# Patient Record
Sex: Female | Born: 1985 | Race: Black or African American | Hispanic: No | Marital: Single | Smoking: Never smoker
Health system: Southern US, Community
[De-identification: ages and names within clinical notes are randomized; demographics above are authoritative.]

## PROBLEM LIST (undated history)

## (undated) DIAGNOSIS — E739 Lactose intolerance, unspecified: Secondary | ICD-10-CM

## (undated) DIAGNOSIS — M549 Dorsalgia, unspecified: Secondary | ICD-10-CM

## (undated) DIAGNOSIS — E7800 Pure hypercholesterolemia, unspecified: Secondary | ICD-10-CM

## (undated) DIAGNOSIS — R112 Nausea with vomiting, unspecified: Secondary | ICD-10-CM

## (undated) DIAGNOSIS — M419 Scoliosis, unspecified: Secondary | ICD-10-CM

## (undated) DIAGNOSIS — I10 Essential (primary) hypertension: Secondary | ICD-10-CM

## (undated) DIAGNOSIS — D649 Anemia, unspecified: Secondary | ICD-10-CM

## (undated) DIAGNOSIS — F419 Anxiety disorder, unspecified: Secondary | ICD-10-CM

## (undated) DIAGNOSIS — F32A Depression, unspecified: Secondary | ICD-10-CM

## (undated) DIAGNOSIS — E559 Vitamin D deficiency, unspecified: Secondary | ICD-10-CM

## (undated) DIAGNOSIS — K811 Chronic cholecystitis: Secondary | ICD-10-CM

## (undated) HISTORY — DX: Depression, unspecified: F32.A

## (undated) HISTORY — DX: Pure hypercholesterolemia, unspecified: E78.00

## (undated) HISTORY — DX: Essential (primary) hypertension: I10

## (undated) HISTORY — DX: Dorsalgia, unspecified: M54.9

## (undated) HISTORY — DX: Vitamin D deficiency, unspecified: E55.9

## (undated) HISTORY — DX: Anemia, unspecified: D64.9

## (undated) HISTORY — PX: FIXATION KYPHOPLASTY: SHX860

## (undated) HISTORY — DX: Lactose intolerance, unspecified: E73.9

---
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
## 2006-06-17 ENCOUNTER — Emergency Department (HOSPITAL_COMMUNITY): Admission: EM | Admit: 2006-06-17 | Discharge: 2006-06-17 | Payer: Self-pay | Admitting: Emergency Medicine

## 2008-08-19 ENCOUNTER — Emergency Department (HOSPITAL_COMMUNITY): Admission: EM | Admit: 2008-08-19 | Discharge: 2008-08-19 | Payer: Self-pay | Admitting: Family Medicine

## 2009-11-05 ENCOUNTER — Observation Stay (HOSPITAL_COMMUNITY): Admission: AD | Admit: 2009-11-05 | Discharge: 2009-11-06 | Payer: Self-pay | Admitting: Obstetrics and Gynecology

## 2010-01-25 ENCOUNTER — Inpatient Hospital Stay (HOSPITAL_COMMUNITY): Admission: RE | Admit: 2010-01-25 | Discharge: 2010-01-28 | Payer: Self-pay | Admitting: Obstetrics and Gynecology

## 2011-01-08 LAB — COMPREHENSIVE METABOLIC PANEL
BUN: 4 mg/dL — ABNORMAL LOW (ref 6–23)
Calcium: 9.2 mg/dL (ref 8.4–10.5)
Creatinine, Ser: 0.35 mg/dL — ABNORMAL LOW (ref 0.4–1.2)
GFR calc non Af Amer: >60 mL/min (ref >60)
Glucose, Bld: 86 mg/dL (ref 70–99)
Total Protein: 6.4 g/dL (ref 6.0–8.3)

## 2011-01-08 LAB — CBC
HCT: 34.5 % — ABNORMAL LOW (ref 36.0–46.0)
Hemoglobin: 11.6 g/dL — ABNORMAL LOW (ref 12.0–15.0)
MCHC: 33.6 g/dL (ref 30.0–36.0)
MCV: 86.2 fL (ref 78.0–100.0)
Platelets: 315 10*3/uL (ref 150–400)
RBC: 4 MIL/uL (ref 3.87–5.11)
RDW: 14.8 % (ref 11.5–15.5)
WBC: 8.7 10*3/uL (ref 4.0–10.5)

## 2011-01-08 LAB — URIC ACID: Uric Acid, Serum: 3.5 mg/dL (ref 2.4–7.0)

## 2011-01-11 LAB — CBC
HCT: 35 % — ABNORMAL LOW (ref 36.0–46.0)
Hemoglobin: 11.7 g/dL — ABNORMAL LOW (ref 12.0–15.0)
Hemoglobin: 8.5 g/dL — ABNORMAL LOW (ref 12.0–15.0)
MCHC: 33.8 g/dL (ref 30.0–36.0)
MCV: 86.5 fL (ref 78.0–100.0)
RBC: 2.92 MIL/uL — ABNORMAL LOW (ref 3.87–5.11)
WBC: 17.3 10*3/uL — ABNORMAL HIGH (ref 4.0–10.5)
WBC: 8.5 10*3/uL (ref 4.0–10.5)

## 2011-07-25 LAB — POCT INFECTIOUS MONO SCREEN: Mono Screen: NEGATIVE

## 2011-11-03 ENCOUNTER — Emergency Department (INDEPENDENT_AMBULATORY_CARE_PROVIDER_SITE_OTHER)
Admission: EM | Admit: 2011-11-03 | Discharge: 2011-11-03 | Disposition: A | Discharge status: Home / Self Care | Payer: 59 | Source: Home / Self Care | Attending: Family Medicine | Admitting: Family Medicine

## 2011-11-03 ENCOUNTER — Encounter (HOSPITAL_COMMUNITY): Payer: Self-pay

## 2011-11-03 DIAGNOSIS — H109 Unspecified conjunctivitis: Secondary | ICD-10-CM

## 2011-11-03 MED ORDER — CIPROFLOXACIN HCL 0.3 % OP SOLN: 1.0000 [drp] | OPHTHALMIC | Status: AC

## 2011-11-03 NOTE — ED Notes (Signed)
2 day duration of right eye reddness

## 2011-12-05 NOTE — ED Provider Notes (Signed)
History     CSN: [id number]  Arrival date & time 11/03/11  8295   First MD Initiated Contact with Patient 11/03/11 1926      Chief Complaint  Patient presents with  . Conjunctivitis    (Consider location/radiation/quality/duration/timing/severity/associated sxs/prior treatment) Patient is a 26 y.o. female presenting with conjunctivitis. The history is provided by the patient.  Conjunctivitis  The current episode started 2 days ago. The onset was sudden. The problem has been unchanged. The symptoms are aggravated by nothing. Associated symptoms include eye itching and eye redness. Pertinent negatives include no eye discharge.    History reviewed. No pertinent past medical history.  Past Surgical History  Procedure Date  . Cesarean section   . Fixation kyphoplasty     History reviewed. No pertinent family history.  History  Substance Use Topics  . Smoking status: Never Smoker   . Smokeless tobacco: Not on file  . Alcohol Use: Yes    OB History    Grav Para Term Preterm Abortions TAB SAB Ect Mult Living                  Review of Systems  Constitutional: Negative.   HENT: Negative.   Eyes: Positive for redness and itching. Negative for discharge and visual disturbance.  Respiratory: Negative.   Cardiovascular: Negative.   Gastrointestinal: Negative.   Genitourinary: Negative.   Musculoskeletal: Negative.   Skin: Negative.   Neurological: Negative.     Allergies  Review of patient's allergies indicates no known allergies.  Home Medications  No current outpatient prescriptions on file.  BP 133/78  Pulse 89  Temp(Src) 98.7 F (37.1 C) (Oral)  Resp 18  SpO2 100%  LMP 10/08/2011  Physical Exam  Nursing note and vitals reviewed. Constitutional: She is oriented to person, place, and time. She appears well-developed and well-nourished.  HENT:  Head: Normocephalic and atraumatic.  Eyes: EOM and lids are normal. Pupils are equal, round, and reactive to  light. Right conjunctiva is injected. Left conjunctiva is not injected.  Neck: Normal range of motion.  Pulmonary/Chest: Effort normal.  Musculoskeletal: Normal range of motion.  Neurological: She is alert and oriented to person, place, and time.  Skin: Skin is warm and dry.  Psychiatric: Her behavior is normal.    ED Course  Procedures (including critical care time)  Labs Reviewed - No data to display No results found.   1. Conjunctivitis       MDM  Likely viral but will cover with ciprofloxacin; follow up if no improvement        Richardo Priest, MD 12/05/11 1945

## 2014-02-24 ENCOUNTER — Emergency Department (INDEPENDENT_AMBULATORY_CARE_PROVIDER_SITE_OTHER)
Admission: EM | Admit: 2014-02-24 | Discharge: 2014-02-24 | Disposition: A | Discharge status: Home / Self Care | Payer: Medicaid Other | Source: Home / Self Care

## 2014-02-24 ENCOUNTER — Encounter (HOSPITAL_COMMUNITY): Payer: Self-pay | Admitting: Emergency Medicine

## 2014-02-24 ENCOUNTER — Other Ambulatory Visit (HOSPITAL_COMMUNITY)
Admission: RE | Admit: 2014-02-24 | Discharge: 2014-02-24 | Disposition: A | Discharge status: Home / Self Care | Payer: Medicaid Other | Source: Ambulatory Visit | Attending: Emergency Medicine | Admitting: Emergency Medicine

## 2014-02-24 DIAGNOSIS — A499 Bacterial infection, unspecified: Secondary | ICD-10-CM

## 2014-02-24 DIAGNOSIS — Z113 Encounter for screening for infections with a predominantly sexual mode of transmission: Secondary | ICD-10-CM | POA: Insufficient documentation

## 2014-02-24 DIAGNOSIS — N760 Acute vaginitis: Secondary | ICD-10-CM

## 2014-02-24 DIAGNOSIS — N72 Inflammatory disease of cervix uteri: Secondary | ICD-10-CM

## 2014-02-24 DIAGNOSIS — N898 Other specified noninflammatory disorders of vagina: Secondary | ICD-10-CM

## 2014-02-24 DIAGNOSIS — B9689 Other specified bacterial agents as the cause of diseases classified elsewhere: Secondary | ICD-10-CM

## 2014-02-24 DIAGNOSIS — R102 Pelvic and perineal pain: Secondary | ICD-10-CM

## 2014-02-24 DIAGNOSIS — N949 Unspecified condition associated with female genital organs and menstrual cycle: Secondary | ICD-10-CM

## 2014-02-24 LAB — POCT URINALYSIS DIP (DEVICE)
Bilirubin Urine: NEGATIVE
GLUCOSE, UA: NEGATIVE mg/dL
HGB URINE DIPSTICK: NEGATIVE
Ketones, ur: NEGATIVE mg/dL
NITRITE: NEGATIVE
Protein, ur: NEGATIVE mg/dL
Specific Gravity, Urine: 1.02 (ref 1.005–1.030)
UROBILINOGEN UA: 1 mg/dL (ref 0.0–1.0)
pH: 6.5 (ref 5.0–8.0)

## 2014-02-24 LAB — OCCULT BLOOD, POC DEVICE: Fecal Occult Bld: NEGATIVE

## 2014-02-24 LAB — POCT PREGNANCY, URINE: Preg Test, Ur: NEGATIVE

## 2014-02-24 MED ORDER — AZITHROMYCIN 250 MG PO TABS
1000.0000 mg | ORAL_TABLET | Freq: Every day | ORAL | Status: DC
  Administered 2014-02-24: 1000 mg via ORAL

## 2014-02-24 MED ORDER — LIDOCAINE HCL (PF) 1 % IJ SOLN
INTRAMUSCULAR | Status: AC
  Filled 2014-02-24: qty 5

## 2014-02-24 MED ORDER — CEFTRIAXONE SODIUM 1 G IJ SOLR
INTRAMUSCULAR | Status: AC
  Filled 2014-02-24: qty 10

## 2014-02-24 MED ORDER — CEFTRIAXONE SODIUM 250 MG IJ SOLR
500.0000 mg | Freq: Once | INTRAMUSCULAR | Status: AC
  Administered 2014-02-24: 500 mg via INTRAMUSCULAR

## 2014-02-24 MED ORDER — METRONIDAZOLE 500 MG PO TABS: 500.0000 mg | ORAL_TABLET | Freq: Two times a day (BID) | ORAL | Status: DC

## 2014-02-24 MED ORDER — AZITHROMYCIN 250 MG PO TABS
ORAL_TABLET | ORAL | Status: AC
  Filled 2014-02-24: qty 4

## 2014-02-24 NOTE — ED Provider Notes (Signed)
Medical screening examination/treatment/procedure(s) were performed by non-physician practitioner and as supervising physician I was immediately available for consultation/collaboration.  Leslee Homeavid Xyon Lukasik, M.D.  Reuben Likesavid C Crickett Abbett, MD 02/24/14 [phone]

## 2014-02-24 NOTE — Discharge Instructions (Signed)
Bacterial Vaginosis Bacterial vaginosis is a vaginal infection that occurs when the normal balance of bacteria in the vagina is disrupted. It results from an overgrowth of certain bacteria. This is the most common vaginal infection in women of childbearing age. Treatment is important to prevent complications, especially in pregnant women, as it can cause a premature delivery. CAUSES  Bacterial vaginosis is caused by an increase in harmful bacteria that are normally present in smaller amounts in the vagina. Several different kinds of bacteria can cause bacterial vaginosis. However, the reason that the condition develops is not fully understood. RISK FACTORS Certain activities or behaviors can put you at an increased risk of developing bacterial vaginosis, including:  Having a new sex partner or multiple sex partners.  Douching.  Using an intrauterine device (IUD) for contraception. Women do not get bacterial vaginosis from toilet seats, bedding, swimming pools, or contact with objects around them. SIGNS AND SYMPTOMS  Some women with bacterial vaginosis have no signs or symptoms. Common symptoms include:  Grey vaginal discharge.  A fishlike odor with discharge, especially after sexual intercourse.  Itching or burning of the vagina and vulva.  Burning or pain with urination. DIAGNOSIS  Your health care provider will take a medical history and examine the vagina for signs of bacterial vaginosis. A sample of vaginal fluid may be taken. Your health care provider will look at this sample under a microscope to check for bacteria and abnormal cells. A vaginal pH test may also be done.  TREATMENT  Bacterial vaginosis may be treated with antibiotic medicines. These may be given in the form of a pill or a vaginal cream. A second round of antibiotics may be prescribed if the condition comes back after treatment.  HOME CARE INSTRUCTIONS   Only take over-the-counter or prescription medicines as  directed by your health care provider.  If antibiotic medicine was prescribed, take it as directed. Make sure you finish it even if you start to feel better.  Do not have sex until treatment is completed.  Tell all sexual partners that you have a vaginal infection. They should see their health care provider and be treated if they have problems, such as a mild rash or itching.  Practice safe sex by using condoms and only having one sex partner. SEEK MEDICAL CARE IF:   Your symptoms are not improving after 3 days of treatment.  You have increased discharge or pain.  You have a fever. MAKE SURE YOU:   Understand these instructions.  Will watch your condition.  Will get help right away if you are not doing well or get worse. FOR MORE INFORMATION  Centers for Disease Control and Prevention, Division of STD Prevention: SolutionApps.co.zawww.cdc.gov/std American Sexual Health Association (ASHA): www.ashastd.org  Document Released: 10/09/2005 Document Revised: 07/30/2013 Document Reviewed: 05/21/2013 [facility] Patient Information 2014 [place], [place].  Cervicitis Cervicitis is a soreness and swelling (inflammation) of the cervix. Your cervix is located at the bottom of your uterus. It opens up to the vagina. CAUSES   Sexually transmitted infections (STIs).   Allergic reaction.   Medicines or birth control devices that are put in the vagina.   Injury to the cervix.   Bacterial infections.  RISK FACTORS You are at greater risk if you:  Have unprotected sexual intercourse.  Have sexual intercourse with many partners.  Began sexual intercourse at an early age.  Have a history of STIs. SYMPTOMS  There may be no symptoms. If symptoms occur, they may include:   Juanita CraverGrey,  white, yellow, or bad-smelling vaginal discharge.   Pain or itching of the area outside the vagina.   Painful sexual intercourse.   Lower abdominal or lower back pain, especially during intercourse.   Frequent  urination.   Abnormal vaginal bleeding between periods, after sexual intercourse, or after menopause.   Pressure or a heavy feeling in the pelvis.  DIAGNOSIS  Diagnosis is made after a pelvic exam. Other tests may include:   Examination of any discharge under a microscope (wet prep).   A Pap test.  TREATMENT  Treatment will depend on the cause of cervicitis. If it is caused by an STI, both you and your partner will need to be treated. Antibiotic medicines will be given.  HOME CARE INSTRUCTIONS   Do not have sexual intercourse until your health care provider says it is okay.   Do not have sexual intercourse until your partner has been treated, if your cervicitis is caused by an STI.   Take your antibiotics as directed. Finish them even if you start to feel better.  SEEK MEDICAL CARE IF:  Your symptoms come back.   You have a fever.  MAKE SURE YOU:   Understand these instructions.  Will watch your condition.  Will get help right away if you are not doing well or get worse. Document Released: 10/09/2005 Document Revised: 06/11/2013 Document Reviewed: 04/02/2013 [facility] Patient Information 2014 [place], [place].  Pelvic Inflammatory Disease Pelvic inflammatory disease (PID) refers to an infection in some or all of the female organs. The infection can be in the uterus, ovaries, fallopian tubes, or the surrounding tissues in the pelvis. PID can cause abdominal or pelvic pain that comes on suddenly (acute pelvic pain). PID is a serious infection because it can lead to lasting (chronic) pelvic pain or the inability to have children (infertile).  CAUSES  The infection is often caused by the normal bacteria found in the vaginal tissues. PID may also be caused by an infection that is spread during sexual contact. PID can also occur following:   The birth of a baby.   A miscarriage.   An abortion.   Major pelvic surgery.   The use of an intrauterine device (IUD).    A sexual assault.  RISK FACTORS Certain factors can put a person at higher risk for PID, such as:  Being younger than 25 years.  Being sexually active at [place] age.  Usingnonbarrier contraception.  Havingmultiple sexual partners.  Having sex with someone who has symptoms of a genital infection.  Using oral contraception. Other times, certain behaviors can increase the possibility of getting PID, such as:  Having sex during your period.  Using a vaginal douche.  Having an intrauterine device (IUD) in place. SYMPTOMS   Abdominal or pelvic pain.   Fever.   Chills.   Abnormal vaginal discharge.  Abnormal uterine bleeding.   Unusual pain shortly after finishing your period. DIAGNOSIS  Your caregiver will choose some of the following methods to make a diagnosis, such as:   Performinga physical exam and history. A pelvic exam typically reveals a very tender uterus and surrounding pelvis.   Ordering laboratory tests including a pregnancy test, blood tests, and urine test.  Orderingcultures of the vagina and cervix to check for a sexually transmitted infection (STI).  Performing an ultrasound.   Performing a laparoscopic procedure to look inside the pelvis.  TREATMENT   Antibiotic medicines may be prescribed and taken by mouth.   Sexual partners may be treated when  the infection is caused by a sexually transmitted disease (STD).   Hospitalization may be needed to give antibiotics intravenously.  Surgery may be needed, but this is rare. It may take weeks until you are completely well. If you are diagnosed with PID, you should also be checked for human immunodeficiency virus (HIV). HOME CARE INSTRUCTIONS   If given, take your antibiotics as directed. Finish the medicine even if you start to feel better.   Only take over-the-counter or prescription medicines for pain, discomfort, or fever as directed by your caregiver.   Do not have sexual  intercourse until treatment is completed or as directed by your caregiver. If PID is confirmed, your recent sexual partner(s) will need treatment.   Keep your follow-up appointments. SEEK MEDICAL CARE IF:   You have increased or abnormal vaginal discharge.   You need prescription medicine for your pain.   You vomit.   You cannot take your medicines.   Your partner has an STD.  SEEK IMMEDIATE MEDICAL CARE IF:   You have a fever.   You have increased abdominal or pelvic pain.   You have chills.   You have pain when you urinate.   You are not better after 72 hours following treatment.  MAKE SURE YOU:   Understand these instructions.  Will watch your condition.  Will get help right away if you are not doing well or get worse. Document Released: 10/09/2005 Document Revised: 02/03/2013 Document Reviewed: 10/05/2011 [facility] Patient Information 2014 [place], [place].  Sexually Transmitted Disease A sexually transmitted disease (STD) is a disease or infection. It may be passed from person to person. It usually is passed during sex. STDs can be spread by different types of germs. These germs are bacteria, viruses, and parasites. An STD can be passed through:  Spit (saliva).  Semen.  Blood.  Mucus from the vagina.  Pee (urine). HOW CAN I LESSEN MY CHANCES OF GETTING AN STD?  Only use condoms labeled "latex," dental dams, and lubricants that wash away with water (water soluble). Do not use petroleum jelly or oils.  Get shots (vaccines) for HPV and hepatitis.  Avoid risky sex behavior that can break the skin. WHAT SHOULD I DO IF I THINK I HAVE AN STD?  See your doctor.  Tell your sex partner(s) that you have an STD. They should be tested and treated.  Do not have sex until your doctor says it is OK. WHEN SHOULD I GET HELP? Get help if:  You have bad belly (abdominal) pain.  You are a man and have puffiness (swelling) or pain in your testicles.  You  are a woman and have puffiness in your vagina. MAKE SURE YOU:  Understand these instructions. Document Released: 11/16/2004 Document Revised: 07/30/2013 Document Reviewed: 04/04/2013 [facility] Patient Information 2014 [place], [place].

## 2014-02-24 NOTE — ED Notes (Signed)
Patient c/o stomach and rectal pain onset this morning. Patient denies urinary problems or changes in bowel movements. Pt is alert and oriented and in no acute distress.

## 2014-02-24 NOTE — ED Provider Notes (Signed)
CSN: [id number]     Arrival date & time 02/24/14  1501 History   First MD Initiated Contact with Patient 02/24/14 1640     Chief Complaint  Patient presents with  . Abdominal Pain  . Rectal Pain   (Consider location/radiation/quality/duration/timing/severity/associated sxs/prior Treatment) HPI Comments: 28 year old female with severe obesity presents with pain in the lower mid pelvis. Occasionally she will have pain in the rectum. It started this morning around 6:45 AM. It is becoming worse. She denies vaginal symptoms, urinary symptoms or GI symptoms. She has an IUD in place.   History reviewed. No pertinent past medical history. Past Surgical History  Procedure Laterality Date  . Cesarean section    . Fixation kyphoplasty     No family history on file. History  Substance Use Topics  . Smoking status: Never Smoker   . Smokeless tobacco: Not on file  . Alcohol Use: Yes   OB History   Grav Para Term Preterm Abortions TAB SAB Ect Mult Living                 Review of Systems  Constitutional: Negative.   Respiratory: Negative.   Cardiovascular: Negative for chest pain.  Gastrointestinal: Positive for rectal pain.  Genitourinary: Positive for pelvic pain. Negative for dysuria, frequency, flank pain, vaginal bleeding, vaginal discharge, difficulty urinating, vaginal pain and menstrual problem.  Musculoskeletal: Negative.   Skin: Negative.     Allergies  Review of patient's allergies indicates no known allergies.  Home Medications   Prior to Admission medications   Not on File   BP 156/98  Pulse 96  Temp(Src) 98.8 F (37.1 C) (Oral)  Resp 18  SpO2 100%  LMP 02/09/2014 Physical Exam  Nursing note and vitals reviewed. Constitutional: She is oriented to person, place, and time. She appears well-developed. No distress.  Severe obesity  Eyes: Conjunctivae and EOM are normal.  Neck: Normal range of motion. Neck supple.  Cardiovascular: Normal rate and regular rhythm.    Pulmonary/Chest: Effort normal. No respiratory distress.  Abdominal: Soft. There is no tenderness. There is no rebound and no guarding.  Obese  Genitourinary: Guaiac negative stool.  Anterior pelvic exam reveals tenderness across the mid pelvis. Normal external female genitalia Some difficulty inserting speculum into vagina due to large body habitus. Portion of cx visible along with os . There is a PID string. Copious amount of green to white vag D/C. Cx tender. Speculum snaped "broke under the pressure" during the exam but able to obtain cytology specimens. Rectal exam limited due to body habitus. Nl sphincter. Able to DRE approx 2 cm only. Brown stool. No tenderness, masses.   Neurological: She is alert and oriented to person, place, and time. She exhibits normal muscle tone.  Skin: Skin is warm and dry.    ED Course  Procedures (including critical care time) Labs Review Labs Reviewed  POCT URINALYSIS DIP (DEVICE) - Abnormal; Notable for the following:    Leukocytes, UA TRACE (*)    All other components within normal limits  POCT PREGNANCY, URINE  OCCULT BLOOD, POC DEVICE  CERVICOVAGINAL ANCILLARY ONLY    Imaging Review No results found.   MDM   1. Cervicitis   2. BV (bacterial vaginosis)   3. Vaginal discharge   4. Pelvic pain     Cytology pending Rocephin 500 mg IM azithro 1 gm po Flagyl 500 bid Rx     Hayden Rasmussenavid Loriann Bosserman, NP 02/24/14 1728  Hayden Rasmussenavid Kailee Essman, NP 02/24/14 1730

## 2014-02-25 LAB — CERVICOVAGINAL ANCILLARY ONLY
Chlamydia: NEGATIVE
Neisseria Gonorrhea: NEGATIVE
Wet Prep (BD Affirm): NEGATIVE
Wet Prep (BD Affirm): NEGATIVE
Wet Prep (BD Affirm): NEGATIVE

## 2014-03-13 ENCOUNTER — Emergency Department (HOSPITAL_COMMUNITY): Payer: Medicaid Other

## 2014-03-13 ENCOUNTER — Encounter (HOSPITAL_COMMUNITY): Payer: Self-pay | Admitting: Emergency Medicine

## 2014-03-13 ENCOUNTER — Emergency Department (HOSPITAL_COMMUNITY)
Admission: EM | Admit: 2014-03-13 | Discharge: 2014-03-13 | Disposition: A | Discharge status: Home / Self Care | Payer: Medicaid Other | Attending: Emergency Medicine | Admitting: Emergency Medicine

## 2014-03-13 DIAGNOSIS — M546 Pain in thoracic spine: Secondary | ICD-10-CM | POA: Insufficient documentation

## 2014-03-13 DIAGNOSIS — Z9889 Other specified postprocedural states: Secondary | ICD-10-CM | POA: Insufficient documentation

## 2014-03-13 DIAGNOSIS — Z792 Long term (current) use of antibiotics: Secondary | ICD-10-CM | POA: Insufficient documentation

## 2014-03-13 MED ORDER — METHOCARBAMOL 500 MG PO TABS: 500.0000 mg | ORAL_TABLET | Freq: Two times a day (BID) | ORAL | Status: DC

## 2014-03-13 MED ORDER — IBUPROFEN 800 MG PO TABS: 800.0000 mg | ORAL_TABLET | Freq: Three times a day (TID) | ORAL | Status: DC

## 2014-03-13 NOTE — ED Notes (Signed)
Per pt sts worsening upper back pain over the past few days. sts that she has had back surgery in the past. Denies injury.

## 2014-03-13 NOTE — ED Provider Notes (Signed)
CSN: [id number]     Arrival date & time 03/13/14  1407 History  This chart was scribed for non-physician practitioner, Fayrene HelperBowie Cing [place], PA-C working with Gwyneth SproutWhitney Plunkett, MD by Greggory StallionKayla Andersen, ED scribe. This patient was seen in room TR07C/TR07C and the patient's care was started at 2:35 PM.   Chief Complaint  Patient presents with  . Back Pain   The history is provided by the patient. No language interpreter was used.   HPI Comments: Cathy Yang is a 28 y.o. female with history of scoliosis who presents to the Emergency Department complaining of worsening upper back pain that started 3 days ago. Pain does not radiate. Bending down and moving side to side worsens the pain. She has taken ibuprofen with no relief. Pt has had back surgery for her scoliosis when she was 14 years ago and has had intermittent back pain since then. States this is worse than her normal pain. Denies fever, hemoptysis, trouble breathing, numbness.   History reviewed. No pertinent past medical history. Past Surgical History  Procedure Laterality Date  . Cesarean section    . Fixation kyphoplasty     History reviewed. No pertinent family history. History  Substance Use Topics  . Smoking status: Never Smoker   . Smokeless tobacco: Not on file  . Alcohol Use: Yes   OB History   Grav Para Term Preterm Abortions TAB SAB Ect Mult Living                 Review of Systems  Constitutional: Negative for fever.  Respiratory: Negative for cough.   Musculoskeletal: Positive for back pain.  Neurological: Negative for numbness.  All other systems reviewed and are negative.  Allergies  Review of patient's allergies indicates no known allergies.  Home Medications   Prior to Admission medications   Medication Sig Start Date End Date Taking? Authorizing Provider  metroNIDAZOLE (FLAGYL) 500 MG tablet Take 1 tablet (500 mg total) by mouth 2 (two) times daily. X 7 days 02/24/14   Hayden Rasmussenavid Mabe, NP   BP 146/95  Pulse 111   Temp(Src) 98 F (36.7 C) (Oral)  Resp 16  Ht 5\' 5"  (1.651 m)  Wt 344 lb (156.037 kg)  BMI 57.24 kg/m2  SpO2 98%  LMP 03/08/2014  Physical Exam  Nursing note and vitals reviewed. Constitutional: She is oriented to person, place, and time. She appears well-developed and well-nourished. No distress.  HENT:  Head: Normocephalic and atraumatic.  Eyes: EOM are normal.  Neck: Neck supple. No tracheal deviation present.  Cardiovascular: Normal rate.   Pulmonary/Chest: Effort normal. No respiratory distress.  Musculoskeletal: Normal range of motion.  Midline surgical scar extending from neck down to buttock. Focal point tenderness to mid thoracic but no crepitus, step offs or deformity. No overlying skin changes.   Neurological: She is alert and oriented to person, place, and time.  Skin: Skin is warm and dry.  Psychiatric: She has a normal mood and affect. Her behavior is normal.    ED Course  Procedures (including critical care time)  DIAGNOSTIC STUDIES: Oxygen Saturation is 98% on RA, normal by my interpretation.    COORDINATION OF CARE: 2:38 PM-Discussed treatment plan which includes xray with pt at bedside and pt agreed to plan.   3:26 PM Xray neg for migration of screws or abnormal rod alignment.  No signs of infection. I have low suspicion for PE as the source of her pain given minimal risk factors.  Mild tachycardia noted, but normal on  vital sign recheck.  Pt will be d/c with pain medication, muscle relaxant and ortho referral. Return precaution discussed.  Pt aware of plan.   Labs Review Labs Reviewed - No data to display  Imaging Review Dg Thoracic Spine 4v  03/13/2014   CLINICAL DATA:  Thoracic spine pain  EXAM: THORACIC SPINE - 4+ VIEW  COMPARISON:  None.  FINDINGS: Three views of thoracic spine submitted. No acute fracture or subluxation. Mild dextroscoliosis. Metallic fixation rods are noted thoracolumbar spine.  IMPRESSION: No acute fracture or subluxation. Mild  dextroscoliosis. Metallic rods thoracolumbar spine.   Electronically Signed   By: Natasha Mead M.D.   On: 03/13/2014 15:13     EKG Interpretation None      MDM   Final diagnoses:  Thoracic spine pain    BP 152/99  Pulse 88  Temp(Src) 98 F (36.7 C) (Oral)  Resp 18  Ht 5\' 5"  (1.651 m)  Wt 344 lb (156.037 kg)  BMI 57.24 kg/m2  SpO2 99%  LMP 03/08/2014   I personally performed the services described in this documentation, which was scribed in my presence. The recorded information has been reviewed and is accurate.  Fayrene Helper, PA-C 03/13/14 1531

## 2014-03-13 NOTE — Discharge Instructions (Signed)
Arthralgia  Your caregiver has diagnosed you as suffering from an arthralgia. Arthralgia means there is pain in a joint. This can come from many reasons including:  · Bruising the joint which causes soreness (inflammation) in the joint.  · Wear and tear on the joints which occur as we grow older (osteoarthritis).  · Overusing the joint.  · Various forms of arthritis.  · Infections of the joint.  Regardless of the cause of pain in your joint, most of these different pains respond to anti-inflammatory drugs and rest. The exception to this is when a joint is infected, and these cases are treated with antibiotics, if it is a bacterial infection.  HOME CARE INSTRUCTIONS   · Rest the injured area for as long as directed by your caregiver. Then slowly start using the joint as directed by your caregiver and as the pain allows. Crutches as directed may be useful if the ankles, knees or hips are involved. If the knee was splinted or casted, continue use and care as directed. If an stretchy or elastic wrapping bandage has been applied today, it should be removed and re-applied every 3 to 4 hours. It should not be applied tightly, but firmly enough to keep swelling down. Watch toes and feet for swelling, bluish discoloration, coldness, numbness or excessive pain. If any of these problems (symptoms) occur, remove the ace bandage and re-apply more loosely. If these symptoms persist, contact your caregiver or return to this location.  · For the first 24 hours, keep the injured extremity elevated on pillows while lying down.  · Apply ice for 15-20 minutes to the sore joint every couple hours while awake for the first half day. Then 03-04 times per day for the first 48 hours. Put the ice in a plastic bag and place a towel between the bag of ice and your skin.  · Wear any splinting, casting, elastic bandage applications, or slings as instructed.  · Only take over-the-counter or prescription medicines for pain, discomfort, or fever as  directed by your caregiver. Do not use aspirin immediately after the injury unless instructed by your physician. Aspirin can cause increased bleeding and bruising of the tissues.  · If you were given crutches, continue to use them as instructed and do not resume weight bearing on the sore joint until instructed.  Persistent pain and inability to use the sore joint as directed for more than 2 to 3 days are warning signs indicating that you should see a caregiver for a follow-up visit as soon as possible. Initially, a hairline fracture (break in bone) may not be evident on X-rays. Persistent pain and swelling indicate that further evaluation, non-weight bearing or use of the joint (use of crutches or slings as instructed), or further X-rays are indicated. X-rays may sometimes not show a small fracture until a week or 10 days later. Make a follow-up appointment with your own caregiver or one to whom we have referred you. A radiologist (specialist in reading X-rays) may read your X-rays. Make sure you know how you are to obtain your X-ray results. Do not assume everything is normal if you do not hear from us.  SEEK MEDICAL CARE IF:  Bruising, swelling, or pain increases.  SEEK IMMEDIATE MEDICAL CARE IF:   · Your fingers or toes are numb or blue.  · The pain is not responding to medications and continues to stay the same or get worse.  · The pain in your joint becomes severe.  · You   develop a fever over 102° F (38.9° C).  · It becomes impossible to move or use the joint.  MAKE SURE YOU:   · Understand these instructions.  · Will watch your condition.  · Will get help right away if you are not doing well or get worse.  Document Released: 10/09/2005 Document Revised: 01/01/2012 Document Reviewed: 05/27/2008  ExitCare® Patient Information ©2014 [employer].

## 2014-03-13 NOTE — ED Notes (Signed)
C/o mid and upper back pain x 3 days. Hx of scoliosis with rods at age 28. "I think my rod has slipped".

## 2014-03-16 NOTE — ED Provider Notes (Signed)
Medical screening examination/treatment/procedure(s) were performed by non-physician practitioner and as supervising physician I was immediately available for consultation/collaboration.   EKG Interpretation None        Gwyneth Sprout, MD 03/16/14 1516

## 2014-05-17 ENCOUNTER — Emergency Department (HOSPITAL_COMMUNITY)
Admission: EM | Admit: 2014-05-17 | Discharge: 2014-05-17 | Disposition: A | Discharge status: Home / Self Care | Payer: Medicaid Other | Attending: Emergency Medicine | Admitting: Emergency Medicine

## 2014-05-17 ENCOUNTER — Encounter (HOSPITAL_COMMUNITY): Payer: Self-pay | Admitting: Emergency Medicine

## 2014-05-17 DIAGNOSIS — H1131 Conjunctival hemorrhage, right eye: Secondary | ICD-10-CM

## 2014-05-17 DIAGNOSIS — Z791 Long term (current) use of non-steroidal anti-inflammatories (NSAID): Secondary | ICD-10-CM | POA: Insufficient documentation

## 2014-05-17 DIAGNOSIS — H5710 Ocular pain, unspecified eye: Secondary | ICD-10-CM | POA: Insufficient documentation

## 2014-05-17 DIAGNOSIS — H109 Unspecified conjunctivitis: Secondary | ICD-10-CM | POA: Insufficient documentation

## 2014-05-17 DIAGNOSIS — Z79899 Other long term (current) drug therapy: Secondary | ICD-10-CM | POA: Insufficient documentation

## 2014-05-17 DIAGNOSIS — H1130 Conjunctival hemorrhage, unspecified eye: Secondary | ICD-10-CM | POA: Diagnosis not present

## 2014-05-17 DIAGNOSIS — Z792 Long term (current) use of antibiotics: Secondary | ICD-10-CM | POA: Insufficient documentation

## 2014-05-17 MED ORDER — TETRACAINE HCL 0.5 % OP SOLN
1.0000 [drp] | Freq: Once | OPHTHALMIC | Status: AC
  Administered 2014-05-17: 1 [drp] via OPHTHALMIC
  Filled 2014-05-17: qty 2

## 2014-05-17 MED ORDER — FLUORESCEIN SODIUM 1 MG OP STRP
1.0000 | ORAL_STRIP | Freq: Once | OPHTHALMIC | Status: AC
  Administered 2014-05-17: 1 via OPHTHALMIC
  Filled 2014-05-17: qty 1

## 2014-05-17 MED ORDER — CIPROFLOXACIN HCL 0.3 % OP SOLN: 1.0000 [drp] | OPHTHALMIC | Status: DC

## 2014-05-17 MED ORDER — NAPHAZOLINE-PHENIRAMINE 0.025-0.3 % OP SOLN: 1.0000 [drp] | OPHTHALMIC | Status: DC | PRN

## 2014-05-17 NOTE — Discharge Instructions (Signed)
Conjunctivitis Conjunctivitis is commonly called "pink eye." Conjunctivitis can be caused by bacterial or viral infection, allergies, or injuries. There is usually redness of the lining of the eye, itching, discomfort, and sometimes discharge. There may be deposits of matter along the eyelids. A viral infection usually causes a watery discharge, while a bacterial infection causes a yellowish, thick discharge. Pink eye is very contagious and spreads by direct contact. You may be given antibiotic eyedrops as part of your treatment. Before using your eye medicine, remove all drainage from the eye by washing gently with warm water and cotton balls. Continue to use the medication until you have awakened 2 mornings in a row without discharge from the eye. Do not rub your eye. This increases the irritation and helps spread infection. Use separate towels from other household members. Wash your hands with soap and water before and after touching your eyes. Use cold compresses to reduce pain and sunglasses to relieve irritation from light. Do not wear contact lenses or wear eye makeup until the infection is gone. SEEK MEDICAL CARE IF:   Your symptoms are not better after 3 days of treatment.  You have increased pain or trouble seeing.  The outer eyelids become very red or swollen. Document Released: 11/16/2004 Document Revised: 01/01/2012 Document Reviewed: 10/09/2005 [facility] Patient Information 2015 [place], [place]. This information is not intended to replace advice given to you by your health care provider. Make sure you discuss any questions you have with your health care provider.  Subconjunctival Hemorrhage Your exam shows you have a subconjunctival hemorrhage. This is a harmless collection of blood covering a portion of the white of the eye. This condition may be due to injury or to straining (lifting, sneezing, or coughing). Often, there is no known cause. Subconjunctival blood does not cause pain or  vision problems. This condition needs no treatment. It will take 1 to 2 weeks for the blood to dissolve. If you take aspirin or Coumadin on a daily basis or if you have high blood pressure, you should check with your doctor about the need for further treatment. Please call your doctor if you have problems with your vision, pain around the eye, or any other concerns about your condition. Document Released: 11/16/2004 Document Revised: 01/01/2012 Document Reviewed: 09/06/2009 [facility] Patient Information 2015 [place], [place]. This information is not intended to replace advice given to you by your health care provider. Make sure you discuss any questions you have with your health care provider.

## 2014-05-17 NOTE — ED Notes (Signed)
Declined W/C at D/C and was escorted to lobby by RN. 

## 2014-05-17 NOTE — ED Notes (Signed)
Pt. Stated, I woke up with rt. Eye pain and redness.

## 2014-05-17 NOTE — ED Provider Notes (Signed)
CSN: [id number]     Arrival date & time 05/17/14  1241 History  This chart was scribed for non-physician practitioner Junious Silk, PA-C, working with Doug Sou, MD by Leone Payor, ED Scribe. This patient was seen in room TR10C/TR10C and the patient's care was started at 2:29 PM.    Chief Complaint  Patient presents with  . Eye Pain    The history is provided by the patient. No language interpreter was used.    HPI Comments: Cathy Yang is a 28 y.o. female who presents to the Emergency Department complaining of constant, unchanged right eye pain with associated itching, photophobia, blurry vision, and redness upon waking this morning. She describes the pain as a burning sensation. Patient is a regular contact lens user and states she has lenses that can be worn 2 weeks without removal. She states that she has been non-compliant and wore the current lenses continuously for 1 month. She removed the contact lens this morning but states it did not provide any relief.This feels like prior episodes of conjunctivitis.   History reviewed. No pertinent past medical history. Past Surgical History  Procedure Laterality Date  . Cesarean section    . Fixation kyphoplasty     No family history on file. History  Substance Use Topics  . Smoking status: Never Smoker   . Smokeless tobacco: Not on file  . Alcohol Use: Yes   OB History   Grav Para Term Preterm Abortions TAB SAB Ect Mult Living                 Review of Systems  Constitutional: Negative for fever.  Eyes: Positive for photophobia, pain, redness, itching and visual disturbance (blurry). Negative for discharge.  Neurological: Negative for headaches.  All other systems reviewed and are negative.     Allergies  Review of patient's allergies indicates no known allergies.  Home Medications   Prior to Admission medications   Medication Sig Start Date End Date Taking? Authorizing Provider  ibuprofen (ADVIL,MOTRIN) 800 MG  tablet Take 1 tablet (800 mg total) by mouth 3 (three) times daily. 03/13/14   Fayrene Helper, PA-C  methocarbamol (ROBAXIN) 500 MG tablet Take 1 tablet (500 mg total) by mouth 2 (two) times daily. 03/13/14   Fayrene Helper, PA-C  metroNIDAZOLE (FLAGYL) 500 MG tablet Take 1 tablet (500 mg total) by mouth 2 (two) times daily. X 7 days 02/24/14   Hayden Rasmussen, NP   BP 156/104  Pulse 94  Temp(Src) 98.9 F (37.2 C) (Oral)  Resp 18  SpO2 97%  LMP 05/13/2014 Physical Exam  Nursing note and vitals reviewed. Constitutional: She is oriented to person, place, and time. She appears well-developed and well-nourished. No distress.  HENT:  Head: Normocephalic and atraumatic.  Right Ear: External ear normal.  Left Ear: External ear normal.  Nose: Nose normal.  Mouth/Throat: Oropharynx is clear and moist.  Eyes: EOM are normal. Pupils are equal, round, and reactive to light. Right conjunctiva has a hemorrhage.  Slit lamp exam:      The right eye shows no corneal abrasion, no corneal flare, no corneal ulcer, no foreign body, no hyphema, no hypopyon and no fluorescein uptake.  Conjunctival hemorrhage in right eye. Intraocular pressure 21 in right eye.   Neck: Normal range of motion.  Cardiovascular: Normal rate, regular rhythm and normal heart sounds.   Pulmonary/Chest: Effort normal and breath sounds normal. No stridor. No respiratory distress. She has no wheezes. She has no rales.  Abdominal: Soft.  She exhibits no distension.  Musculoskeletal: Normal range of motion.  Neurological: She is alert and oriented to person, place, and time. She has normal strength.  Skin: Skin is warm and dry. She is not diaphoretic. No erythema.  Psychiatric: She has a normal mood and affect. Her behavior is normal.    ED Course  Procedures (including critical care time)  DIAGNOSTIC STUDIES: Oxygen Saturation is 97% on RA, adequate by my interpretation.    COORDINATION OF CARE: 2:30 PM Discussed treatment plan with pt at  bedside and pt agreed to plan.   Labs Review Labs Reviewed - No data to display  Imaging Review No results found.   EKG Interpretation None      MDM   Final diagnoses:  Conjunctival hemorrhage of right eye  Conjunctivitis of right eye    Patient presents to ED with conjunctival hemorrhage and conjunctivitis of right eye. There is no fluorescein dye uptake. IOP is 21 in affected eye. This feels like prior conjunctivitis. Patient was encouraged to discontinue contact use until cleared by her optometrist. Patient was given opthalmology follow up if her symptoms do not improve. Discussed reasons to return to ED immediately. Vital signs stable for discharge. Patient / Family / Caregiver informed of clinical course, understand medical decision-making process, and agree with plan.   I personally performed the services described in this documentation, which was scribed in my presence. The recorded information has been reviewed and is accurate.    Mora BellmanHannah S Solange Emry, PA-C 05/18/14 2000

## 2014-05-21 NOTE — ED Provider Notes (Signed)
Medical screening examination/treatment/procedure(s) were performed by non-physician practitioner and as supervising physician I was immediately available for consultation/collaboration.   EKG Interpretation None       Doug SouSam Romir Klimowicz, MD 05/21/14 1719

## 2014-10-31 ENCOUNTER — Encounter (HOSPITAL_COMMUNITY): Payer: Self-pay | Admitting: Emergency Medicine

## 2014-10-31 ENCOUNTER — Emergency Department (INDEPENDENT_AMBULATORY_CARE_PROVIDER_SITE_OTHER)
Admission: EM | Admit: 2014-10-31 | Discharge: 2014-10-31 | Disposition: A | Discharge status: Home / Self Care | Payer: 59 | Source: Home / Self Care | Attending: Emergency Medicine | Admitting: Emergency Medicine

## 2014-10-31 DIAGNOSIS — M545 Low back pain, unspecified: Secondary | ICD-10-CM

## 2014-10-31 LAB — CBC WITH DIFFERENTIAL/PLATELET
BASOS ABS: 0 10*3/uL (ref 0.0–0.1)
BASOS PCT: 0 % (ref 0–1)
EOS ABS: 0.2 10*3/uL (ref 0.0–0.7)
EOS PCT: 2 % (ref 0–5)
HCT: 40 % (ref 36.0–46.0)
Hemoglobin: 13.2 g/dL (ref 12.0–15.0)
LYMPHS ABS: 3 10*3/uL (ref 0.7–4.0)
Lymphocytes Relative: 36 % (ref 12–46)
MCH: 28.6 pg (ref 26.0–34.0)
MCHC: 33 g/dL (ref 30.0–36.0)
MCV: 86.8 fL (ref 78.0–100.0)
MONO ABS: 0.6 10*3/uL (ref 0.1–1.0)
MONOS PCT: 7 % (ref 3–12)
NEUTROS PCT: 55 % (ref 43–77)
Neutro Abs: 4.4 10*3/uL (ref 1.7–7.7)
PLATELETS: 304 10*3/uL (ref 150–400)
RBC: 4.61 MIL/uL (ref 3.87–5.11)
RDW: 15.3 % (ref 11.5–15.5)
WBC: 8.2 10*3/uL (ref 4.0–10.5)

## 2014-10-31 MED ORDER — KETOROLAC TROMETHAMINE 60 MG/2ML IM SOLN
INTRAMUSCULAR | Status: AC
  Filled 2014-10-31: qty 2

## 2014-10-31 MED ORDER — KETOROLAC TROMETHAMINE 60 MG/2ML IM SOLN
60.0000 mg | Freq: Once | INTRAMUSCULAR | Status: AC
  Administered 2014-10-31: 60 mg via INTRAMUSCULAR

## 2014-10-31 MED ORDER — HYDROCODONE-ACETAMINOPHEN 5-325 MG PO TABS: ORAL_TABLET | ORAL | Status: DC

## 2014-10-31 MED ORDER — CYCLOBENZAPRINE HCL 5 MG PO TABS: 5.0000 mg | ORAL_TABLET | Freq: Three times a day (TID) | ORAL | Status: DC | PRN

## 2014-10-31 MED ORDER — PREDNISONE 20 MG PO TABS: ORAL_TABLET | ORAL | Status: DC

## 2014-10-31 NOTE — ED Notes (Signed)
Pt states that she has been having back pain for over a week pt denies any injury to back

## 2014-10-31 NOTE — ED Provider Notes (Signed)
Chief Complaint   Back Pain   History of Present Illness   Cathy Yang is a 29 year old female who has had a one-week history of lower back pain. This is worse with bending. She denies any injury to the back. The pain does not radiate into the legs and there is no numbness, tingling, weakness, bladder or bowel dysfunction, or saddle anesthesia. She denies any fever, chills, weight loss, abdominal pain, or vomiting. The patient has a history [place] [age] [place] She's had intermittent flareups of pain from time to time since then. She has not have a back surgeon here in town.  Review of Systems   Other than as noted above, the patient denies any of the following symptoms: Systemic:  No fever, chills, or unexplained weight loss. GI:  No abdominal pain or incontinence of bowel. GU:  No dysuria, frequency, urgency, or hematuria. No incontinence of urine or urinary retention.  M-S:  No neck pain or arthritis. Neuro:  No paresthesias, headache, saddle anesthesia, muscular weakness, or progressive neurological deficit.  PMFSH   Past medical history, family history, social history, meds, and allergies were reviewed. Specifically, there is no history of cancer, major trauma, osteoporosis, immunosuppression, or HIV infection.   Physical Examination    Vital signs:  BP 135/74 mmHg  Pulse 111  Temp(Src) 99.4 F (37.4 C) (Oral)  Resp 20  SpO2 100%  LMP 10/09/2014 General:  Alert, oriented, in no distress. Abdomen:  Soft, non-tender.  No organomegaly or mass.  No pulsatile midline abdominal mass or bruit. 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 There is tenderness to palpation in her mid to lower lumbar spine bilaterally. Her back has very limited range of motion with just a few degrees of motion with pain. Straight leg raising was negative. Neuro:  Normal muscle strength, sensations and DTRs. Extremities: Pedal pulses were full, there  was no edema. Skin:  Clear, warm and dry.  No rash.  Labs   Results for orders placed or performed during the hospital encounter of 10/31/14  CBC with Differential  Result Value Ref Range   WBC 8.2 4.0 - 10.5 K/uL   RBC 4.61 3.87 - 5.11 MIL/uL   Hemoglobin 13.2 12.0 - 15.0 g/dL   HCT 74.240.0 59.536.0 - 63.846.0 %   MCV 86.8 78.0 - 100.0 fL   MCH 28.6 26.0 - 34.0 pg   MCHC 33.0 30.0 - 36.0 g/dL   RDW 75.615.3 43.311.5 - 29.515.5 %   Platelets 304 150 - 400 K/uL   Neutrophils Relative % 55 43 - 77 %   Neutro Abs 4.4 1.7 - 7.7 K/uL   Lymphocytes Relative 36 12 - 46 %   Lymphs Abs 3.0 0.7 - 4.0 K/uL   Monocytes Relative 7 3 - 12 %   Monocytes Absolute 0.6 0.1 - 1.0 K/uL   Eosinophils Relative 2 0 - 5 %   Eosinophils Absolute 0.2 0.0 - 0.7 K/uL   Basophils Relative 0 0 - 1 %   Basophils Absolute 0.0 0.0 - 0.1 K/uL    Course in Urgent Care Center   The following medications were given:  Medications  ketorolac (TORADOL) injection 60 mg (60 mg Intramuscular Given 10/31/14 1843)   Assessment   The encounter diagnosis was Midline low back pain without sciatica.  No evidence of cauda equina syndrome, discitis, epidural abscess, fracture, acute pyelonephritis, bleed, cancer, or aneurism.    Plan  1.  Meds:  The following meds were prescribed:   Discharge Medication List as of 10/31/2014  7:21 PM    START taking these medications   Details  cyclobenzaprine (FLEXERIL) 5 MG tablet Take 1 tablet (5 mg total) by mouth 3 (three) times daily as needed for muscle spasms., Starting 10/31/2014, Until Discontinued, Normal    HYDROcodone-acetaminophen (NORCO/VICODIN) 5-325 MG per tablet 1 to 2 tabs every 4 to 6 hours as needed for pain., Print    predniSONE (DELTASONE) 20 MG tablet Take 3 daily for 5 days, 2 daily for 5 days, 1 daily for 5 days., Normal        2.  Patient Education/Counseling:  The patient was given appropriate handouts, self care instructions, and instructed in symptomatic relief. The  patient was encouraged to try to be as active as possible and given some exercises to do followed by moist heat.   3.  Follow up:  The patient was told to follow up here if no better in 3 to 4 days, or sooner if becoming worse in any way, and given some red flag symptoms such as worsening pain or new neurological symptoms which would prompt immediate return.  Follow up with Dr. Tressie Stalker as soon as possible.     Reuben Likes, MD 10/31/14 2017

## 2014-10-31 NOTE — Discharge Instructions (Signed)
Take meds as directed.  Follow up with Dr. Lovell SheehanJenkins as soon as possible. May heat or cold.

## 2014-12-22 ENCOUNTER — Emergency Department (INDEPENDENT_AMBULATORY_CARE_PROVIDER_SITE_OTHER)
Admission: EM | Admit: 2014-12-22 | Discharge: 2014-12-22 | Disposition: A | Discharge status: Home / Self Care | Payer: 59 | Source: Home / Self Care | Attending: Family Medicine | Admitting: Family Medicine

## 2014-12-22 ENCOUNTER — Encounter (HOSPITAL_COMMUNITY): Payer: Self-pay | Admitting: Emergency Medicine

## 2014-12-22 DIAGNOSIS — M546 Pain in thoracic spine: Secondary | ICD-10-CM

## 2014-12-22 DIAGNOSIS — M549 Dorsalgia, unspecified: Secondary | ICD-10-CM

## 2014-12-22 MED ORDER — HYDROCODONE-ACETAMINOPHEN 5-325 MG PO TABS: 1.0000 | ORAL_TABLET | Freq: Four times a day (QID) | ORAL | Status: DC | PRN

## 2014-12-22 MED ORDER — CYCLOBENZAPRINE HCL 10 MG PO TABS: 10.0000 mg | ORAL_TABLET | Freq: Two times a day (BID) | ORAL | Status: DC | PRN

## 2014-12-22 NOTE — ED Notes (Signed)
Reports waking with severe upper back pain.  Denies any known injury. Hx of back surgery for scoliosis.  No relief with ibuprofen.   States "on going problem but today pain is more severe".

## 2014-12-22 NOTE — ED Provider Notes (Signed)
A CSN: [id number]     Arrival date & time 12/22/14  1929 History   First MD Initiated Contact with Patient 12/22/14 2015     Chief Complaint  Patient presents with  . Back Pain   (Consider location/radiation/quality/duration/timing/severity/associated sxs/prior Treatment) HPI     29 year old female with history of chronic back pain With intermittent flares presents complaining of back pain. She has pain in her upper back that has been severe since this morning. She was seen here for this problem a couple of months ago and referred to neurosurgery but she was unable to be seen. No extremity numbness or weakness. No loss of bowel or bladder control. No systemic symptoms. No history of IV drug use. She has a history of scoliosis surgery and has rods in her back.  History reviewed. No pertinent past medical history. Past Surgical History  Procedure Laterality Date  . Cesarean section    . Fixation kyphoplasty     History reviewed. No pertinent family history. History  Substance Use Topics  . Smoking status: Never Smoker   . Smokeless tobacco: Not on file  . Alcohol Use: Yes   OB History    No data available     Review of Systems  Constitutional: Negative for fever.  Musculoskeletal: Positive for back pain.  Neurological: Negative for weakness and numbness.  All other systems reviewed and are negative.   Allergies  Review of patient's allergies indicates no known allergies.  Home Medications   Prior to Admission medications   Medication Sig Start Date End Date Taking? Authorizing Provider  Aspirin-Acetaminophen-Caffeine (EXCEDRIN PO) Take 3 tablets by mouth daily as needed (headache).    Historical Provider, MD  ciprofloxacin (CILOXAN) 0.3 % ophthalmic solution Place 1 drop into the right eye every 4 (four) hours. Place one drop in effected eye every 4 hours until follow up with opthalmologist 05/17/14   Mora BellmanHannah S Merrell, PA-C  cyclobenzaprine (FLEXERIL) 10 MG tablet Take 1 tablet  (10 mg total) by mouth 2 (two) times daily as needed for muscle spasms. 12/22/14   Graylon GoodZachary H Ettore Trebilcock, PA-C  cyclobenzaprine (FLEXERIL) 5 MG tablet Take 1 tablet (5 mg total) by mouth 3 (three) times daily as needed for muscle spasms. 10/31/14   Reuben Likesavid C Keller, MD  HYDROcodone-acetaminophen (NORCO) 5-325 MG per tablet Take 1 tablet by mouth every 6 (six) hours as needed for moderate pain. 12/22/14   Graylon GoodZachary H Carolyn Maniscalco, PA-C  HYDROcodone-acetaminophen (NORCO/VICODIN) 5-325 MG per tablet 1 to 2 tabs every 4 to 6 hours as needed for pain. 10/31/14   Reuben Likesavid C Keller, MD  naphazoline-pheniramine (NAPHCON-A) 0.025-0.3 % ophthalmic solution Place 1 drop into the right eye every 4 (four) hours as needed for irritation. 05/17/14   Mora BellmanHannah S Merrell, PA-C  predniSONE (DELTASONE) 20 MG tablet Take 3 daily for 5 days, 2 daily for 5 days, 1 daily for 5 days. 10/31/14   Reuben Likesavid C Keller, MD   BP 155/108 mmHg  Pulse 93  Temp(Src) 98.7 F (37.1 C)  Resp 20  SpO2 100%  LMP 11/01/2014 Physical Exam  Constitutional: She is oriented to person, place, and time. Vital signs are normal. She appears well-developed and well-nourished. No distress.  Morbidly obese habitus  HENT:  Head: Normocephalic and atraumatic.  Pulmonary/Chest: Effort normal. No respiratory distress.  Musculoskeletal:       Thoracic back: She exhibits bony tenderness (midline tenderness about T6). She exhibits no swelling and no deformity.  Neurological: She is alert and oriented to  person, place, and time. She has normal strength. No sensory deficit. Coordination and gait normal.  Skin: Skin is warm and dry. No rash noted. She is not diaphoretic.  Psychiatric: She has a normal mood and affect. Judgment normal.  Nursing note and vitals reviewed.   ED Course  Procedures (including critical care time) Labs Review Labs Reviewed - No data to display  Imaging Review No results found.   MDM   1. Upper back pain    Back pain without red flags. Treat  symptomatically. A referral was provided again for neurosurgery. She has been instructed to go to patient records if she needs to get her notes to deliver to the neurosurgeon's office.  Meds ordered this encounter  Medications  . HYDROcodone-acetaminophen (NORCO) 5-325 MG per tablet    Sig: Take 1 tablet by mouth every 6 (six) hours as needed for moderate pain.    Dispense:  10 tablet    Refill:  0  . cyclobenzaprine (FLEXERIL) 10 MG tablet    Sig: Take 1 tablet (10 mg total) by mouth 2 (two) times daily as needed for muscle spasms.    Dispense:  20 tablet    Refill:  0      Graylon Good, PA-C 12/22/14 2054

## 2014-12-22 NOTE — Discharge Instructions (Signed)

## 2016-10-10 ENCOUNTER — Emergency Department (HOSPITAL_COMMUNITY)
Admission: EM | Admit: 2016-10-10 | Discharge: 2016-10-10 | Disposition: A | Discharge status: Home / Self Care | Payer: Managed Care, Other (non HMO) | Attending: Emergency Medicine | Admitting: Emergency Medicine

## 2016-10-10 ENCOUNTER — Encounter (HOSPITAL_COMMUNITY): Payer: Self-pay

## 2016-10-10 DIAGNOSIS — R1012 Left upper quadrant pain: Secondary | ICD-10-CM | POA: Diagnosis not present

## 2016-10-10 DIAGNOSIS — R112 Nausea with vomiting, unspecified: Secondary | ICD-10-CM | POA: Diagnosis not present

## 2016-10-10 HISTORY — DX: Scoliosis, unspecified: M41.9

## 2016-10-10 LAB — URINALYSIS, ROUTINE W REFLEX MICROSCOPIC
BILIRUBIN URINE: NEGATIVE
GLUCOSE, UA: NEGATIVE mg/dL
Hgb urine dipstick: NEGATIVE
KETONES UR: NEGATIVE mg/dL
NITRITE: NEGATIVE
PH: 5 (ref 5.0–8.0)
Protein, ur: NEGATIVE mg/dL
SPECIFIC GRAVITY, URINE: 1.016 (ref 1.005–1.030)

## 2016-10-10 LAB — CBC
HCT: 41 % (ref 36.0–46.0)
Hemoglobin: 13.4 g/dL (ref 12.0–15.0)
MCH: 28.5 pg (ref 26.0–34.0)
MCHC: 32.7 g/dL (ref 30.0–36.0)
MCV: 87.2 fL (ref 78.0–100.0)
PLATELETS: 356 10*3/uL (ref 150–400)
RBC: 4.7 MIL/uL (ref 3.87–5.11)
RDW: 15 % (ref 11.5–15.5)
WBC: 8.5 10*3/uL (ref 4.0–10.5)

## 2016-10-10 LAB — COMPREHENSIVE METABOLIC PANEL
ALT: 18 U/L (ref 14–54)
AST: 16 U/L (ref 15–41)
Albumin: 3.7 g/dL (ref 3.5–5.0)
Alkaline Phosphatase: 109 U/L (ref 38–126)
Anion gap: 7 (ref 5–15)
BUN: 9 mg/dL (ref 6–20)
CALCIUM: 8.8 mg/dL — AB (ref 8.9–10.3)
CHLORIDE: 103 mmol/L (ref 101–111)
CO2: 27 mmol/L (ref 22–32)
Creatinine, Ser: 0.65 mg/dL (ref 0.44–1.00)
GFR calc Af Amer: >60 mL/min (ref >60)
GFR calc non Af Amer: >60 mL/min (ref >60)
Glucose, Bld: 105 mg/dL — ABNORMAL HIGH (ref 65–99)
Potassium: 3.8 mmol/L (ref 3.5–5.1)
Sodium: 137 mmol/L (ref 135–145)
TOTAL PROTEIN: 8.5 g/dL — AB (ref 6.5–8.1)
Total Bilirubin: 0.5 mg/dL (ref 0.3–1.2)

## 2016-10-10 LAB — LIPASE, BLOOD: LIPASE: 26 U/L (ref 11–51)

## 2016-10-10 LAB — I-STAT BETA HCG BLOOD, ED (MC, WL, AP ONLY): I-stat hCG, quantitative: <5 m[IU]/mL (ref <5)

## 2016-10-10 MED ORDER — GI COCKTAIL ~~LOC~~
30.0000 mL | Freq: Once | ORAL | Status: AC
  Administered 2016-10-10: 30 mL via ORAL
  Filled 2016-10-10: qty 30

## 2016-10-10 MED ORDER — ONDANSETRON HCL 4 MG PO TABS: 4.0000 mg | ORAL_TABLET | Freq: Four times a day (QID) | ORAL | 0 refills | Status: DC

## 2016-10-10 MED ORDER — SUCRALFATE 1 GM/10ML PO SUSP: 1.0000 g | Freq: Three times a day (TID) | ORAL | 0 refills | Status: DC

## 2016-10-10 MED ORDER — ONDANSETRON 4 MG PO TBDP
4.0000 mg | ORAL_TABLET | Freq: Once | ORAL | Status: AC
  Administered 2016-10-10: 4 mg via ORAL
  Filled 2016-10-10: qty 1

## 2016-10-10 MED ORDER — DICYCLOMINE HCL 10 MG PO CAPS
10.0000 mg | ORAL_CAPSULE | Freq: Once | ORAL | Status: AC
Start: 2016-10-10 — End: 2016-10-10
  Administered 2016-10-10: 10 mg via ORAL
  Filled 2016-10-10: qty 1

## 2016-10-10 MED ORDER — DICYCLOMINE HCL 20 MG PO TABS
20.0000 mg | ORAL_TABLET | Freq: Two times a day (BID) | ORAL | 0 refills | Status: DC
Start: 2016-10-10 — End: 2018-06-28

## 2016-10-10 NOTE — ED Triage Notes (Signed)
Patient reports that she has been having LUQ pain and vomiting x 3 days.

## 2016-10-10 NOTE — ED Provider Notes (Signed)
Emergency Department Provider Note   I have reviewed the triage vital signs and the nursing notes.   HISTORY  Chief Complaint Abdominal Pain and Emesis   HPI Cathy Yang is a 30 y.o. female with PMH of obesity and scoliosis who presents to the emergency room in for evaluation of left upper quadrant abdominal pain and vomiting over the last 3 days. Patient states that symptoms are made worse with lying flat. This only makes the pain worse but also increases the nausea. Patient continues to have normal bowel movements and is passing gas. She is not noticed any blood in the vomit. She's had no associated fevers. No right-sided abdominal discomfort. Abdominal pain is intermittent and nonradiating. She is able to keep down water but is unable to eat solid foods without vomiting. No sick contacts. No recent travel.    Past Medical History:  Diagnosis Date  . Scoliosis     There are no active problems to display for this patient.   Past Surgical History:  Procedure Laterality Date  . BACK SURGERY    . CESAREAN SECTION    . FIXATION KYPHOPLASTY      Current Outpatient Rx  . Order #: [id number] Class: Historical Med  . Order #: [id number] Class: Print  . Order #: [id number] Class: Print  . Order #: [id number] Class: Print    Allergies Patient has no known allergies.  Family History  Problem Relation Age of Onset  . Hypertension Mother   . Diabetes Mother     Social History Social History  Substance Use Topics  . Smoking status: Never Smoker  . Smokeless tobacco: Never Used  . Alcohol use Yes     Comment: socially    Review of Systems  Constitutional: No fever/chills Eyes: No visual changes. ENT: No sore throat. Cardiovascular: Denies chest pain. Respiratory: Denies shortness of breath. Gastrointestinal: Positive LUQ abdominal pain. Positive nausea and vomiting.  No diarrhea.  No constipation. Genitourinary: Negative for dysuria. Musculoskeletal: Negative for  back pain. Skin: Negative for rash. Neurological: Negative for headaches, focal weakness or numbness.  10-point ROS otherwise negative.  ____________________________________________   PHYSICAL EXAM:  VITAL SIGNS: ED Triage Vitals  Enc Vitals Group     BP 10/10/16 0814 (!) 163/112     Pulse Rate 10/10/16 0814 98     Resp 10/10/16 0814 16     Temp 10/10/16 0814 98.5 F (36.9 C)     Temp Source 10/10/16 0814 Oral     SpO2 10/10/16 0814 100 %     Weight 10/10/16 0815 (!) 320 lb (145.2 kg)     Height 10/10/16 0815 5\' 5"  (1.651 m)     Pain Score 10/10/16 0815 8   Constitutional: Alert and oriented. Well appearing and in no acute distress. Sitting on edge of bed.  Eyes: Conjunctivae are normal.  Head: Atraumatic. Nose: No congestion/rhinnorhea. Mouth/Throat: Mucous membranes are moist.  Oropharynx non-erythematous. Neck: No stridor. Cardiovascular: Normal rate, regular rhythm. Good peripheral circulation. Grossly normal heart sounds.   Respiratory: Normal respiratory effort.  No retractions. Lungs CTAB. Gastrointestinal: Soft, obese, with very mild LUQ tenderness to palpation. No rebound or guarding.  Musculoskeletal: No lower extremity tenderness nor edema. No gross deformities of extremities. Neurologic:  Normal speech and language. No gross focal neurologic deficits are appreciated.  Skin:  Skin is warm, dry and intact. No rash noted.  ____________________________________________   LABS (all labs ordered are listed, but only abnormal results are displayed)  Labs Reviewed  COMPREHENSIVE METABOLIC PANEL -  Abnormal; Notable for the following:       Result Value   Glucose, Bld 105 (*)    Calcium 8.8 (*)    Total Protein 8.5 (*)    All other components within normal limits  URINALYSIS, ROUTINE W REFLEX MICROSCOPIC - Abnormal; Notable for the following:    APPearance HAZY (*)    Leukocytes, UA TRACE (*)    Bacteria, UA RARE (*)    Squamous Epithelial / LPF 6-30 (*)    All  other components within normal limits  LIPASE, BLOOD  CBC  I-STAT BETA HCG BLOOD, ED ([facility], [facility], [facility] ONLY)   ____________________________________________  RADIOLOGY  None ____________________________________________   PROCEDURES  Procedure(s) performed:   Procedures  None ____________________________________________   INITIAL IMPRESSION / ASSESSMENT AND PLAN / ED COURSE  Pertinent labs & imaging results that were available during my care of the patient were reviewed by me and considered in my medical decision making (see chart for details).  Patient resents emergency department for evaluation of 3 days of nausea, vomiting, and left upper quadrant pain. Pain is worse with lying flat. The patient is in no acute distress. She has very mild tenderness to palpation left upper quadrant. No right upper quadrant tenderness to palpation. No CVA tenderness. Exam is otherwise unremarkable. She is having some nausea currently along with some pain. Unclear if this represents a viral gastritis or developing enteritis. Also considered reflux esophagitis with symptoms worse with lying flat. No evidence to suggest perforated ulcer or viscus. Patient is afebrile with normal vital signs. No lower abdominal symptoms. Low suspicion for gallbladder disease but we'll follow labs and reassess. No chest pain or SOB to suggest cardiopulmonary etiology of pain.   09:46 AM Patient is having improvement in her nausea but continues to have mild left upper quadrant abdominal pain. She is tolerating fluids without difficulty in the emergency department. Suspect a possible developing enteritis. Labs including her enzymes and bilirubin are normal. Kidney function normal. No evidence of urine infection. On reexam the patient's abdomen is mostly soft and nontender. Not feel she warrants a CT scan at this time other investigation. I discussed with her that if her pain suddenly worsens she should return to the emergency  department. I plan to also provide contact information for a local primary care physician at the patient's request. We'll discharge home with Zofran and Bentyl along with Carafate for symptomatically management at home.   At this time, I do not feel there is any life-threatening condition present. I have reviewed and discussed all results (EKG, imaging, lab, urine as appropriate), exam findings with patient. I have reviewed nursing notes and appropriate previous records.  I feel the patient is safe to be discharged home without further emergent workup. Discussed usual and customary return precautions. Patient and family (if present) verbalize understanding and are comfortable with this plan.  Patient will follow-up with their primary care provider. If they do not have a primary care provider, information for follow-up has been provided to them. All questions have been answered.  ____________________________________________  FINAL CLINICAL IMPRESSION(S) / ED DIAGNOSES  Final diagnoses:  Left upper quadrant pain  Non-intractable vomiting with nausea, unspecified vomiting type     MEDICATIONS GIVEN DURING THIS VISIT:  Medications  ondansetron (ZOFRAN-ODT) disintegrating tablet 4 mg (4 mg Oral Given 10/10/16 0857)  dicyclomine (BENTYL) capsule 10 mg (10 mg Oral Given 10/10/16 0858)  gi cocktail (Maalox,Lidocaine,Donnatal) (30 mLs Oral Given 10/10/16 0858)     NEW  OUTPATIENT MEDICATIONS STARTED DURING THIS VISIT:  New Prescriptions   DICYCLOMINE (BENTYL) 20 MG TABLET    Take 1 tablet (20 mg total) by mouth 2 (two) times daily.   ONDANSETRON (ZOFRAN) 4 MG TABLET    Take 1 tablet (4 mg total) by mouth every 6 (six) hours.   SUCRALFATE (CARAFATE) 1 GM/10ML SUSPENSION    Take 10 mLs (1 g total) by mouth 4 (four) times daily -  with meals and at bedtime.     Note:  This document was prepared using Dragon voice recognition software and may include unintentional dictation errors.  Alona BeneJoshua Minola Guin,  MD Emergency Medicine   Maia PlanJoshua G Daziah Hesler, MD 10/10/16 [phone]

## 2016-10-10 NOTE — Discharge Instructions (Signed)

## 2016-12-01 ENCOUNTER — Ambulatory Visit (HOSPITAL_COMMUNITY)
Admission: EM | Admit: 2016-12-01 | Discharge: 2016-12-01 | Disposition: A | Discharge status: Home / Self Care | Payer: Managed Care, Other (non HMO) | Attending: Family Medicine | Admitting: Family Medicine

## 2016-12-01 ENCOUNTER — Encounter (HOSPITAL_COMMUNITY): Payer: Self-pay | Admitting: Emergency Medicine

## 2016-12-01 DIAGNOSIS — R69 Illness, unspecified: Secondary | ICD-10-CM | POA: Diagnosis not present

## 2016-12-01 DIAGNOSIS — R05 Cough: Secondary | ICD-10-CM

## 2016-12-01 DIAGNOSIS — J111 Influenza due to unidentified influenza virus with other respiratory manifestations: Secondary | ICD-10-CM

## 2016-12-01 DIAGNOSIS — R059 Cough, unspecified: Secondary | ICD-10-CM

## 2016-12-01 MED ORDER — BENZONATATE 100 MG PO CAPS: 100.0000 mg | ORAL_CAPSULE | Freq: Three times a day (TID) | ORAL | 0 refills | Status: DC

## 2016-12-01 MED ORDER — OSELTAMIVIR PHOSPHATE 75 MG PO CAPS: 75.0000 mg | ORAL_CAPSULE | Freq: Two times a day (BID) | ORAL | 0 refills | Status: DC

## 2016-12-01 NOTE — ED Notes (Signed)
Salem SenateB. Oxford, NP aware of PT's BP

## 2016-12-01 NOTE — ED Provider Notes (Signed)
CSN: [id number]     Arrival date & time 12/01/16  1503 History   First MD Initiated Contact with Patient 12/01/16 1528     Chief Complaint  Patient presents with  . URI   (Consider location/radiation/quality/duration/timing/severity/associated sxs/prior Treatment) Patent c/o NVD cough, body aches and fever.   The history is provided by the patient.  URI  Presenting symptoms: congestion, cough, fatigue and fever   Severity:  Moderate Onset quality:  Sudden Duration:  5 days Timing:  Constant Chronicity:  New Relieved by:  Nothing Worsened by:  Nothing   Past Medical History:  Diagnosis Date  . Scoliosis    Past Surgical History:  Procedure Laterality Date  . BACK SURGERY    . CESAREAN SECTION    . FIXATION KYPHOPLASTY     Family History  Problem Relation Age of Onset  . Hypertension Mother   . Diabetes Mother    Social History  Substance Use Topics  . Smoking status: Never Smoker  . Smokeless tobacco: Never Used  . Alcohol use Yes     Comment: socially   OB History    No data available     Review of Systems  Constitutional: Positive for fatigue and fever.  HENT: Positive for congestion.   Eyes: Negative.   Respiratory: Positive for cough.   Cardiovascular: Negative.   Gastrointestinal: Negative.   Endocrine: Negative.   Genitourinary: Negative.   Musculoskeletal: Negative.   Allergic/Immunologic: Negative.   Neurological: Negative.   Hematological: Negative.   Psychiatric/Behavioral: Negative.     Allergies  Patient has no known allergies.  Home Medications   Prior to Admission medications   Medication Sig Start Date End Date Taking? Authorizing Provider  aspirin EC 81 MG tablet Take 162 mg by mouth every 4 (four) hours as needed for mild pain.    Historical Provider, MD  benzonatate (TESSALON) 100 MG capsule Take 1 capsule (100 mg total) by mouth every 8 (eight) hours. 12/01/16   Deatra Canter, FNP  dicyclomine (BENTYL) 20 MG tablet Take 1  tablet (20 mg total) by mouth 2 (two) times daily. 10/10/16   Maia Plan, MD  ondansetron (ZOFRAN) 4 MG tablet Take 1 tablet (4 mg total) by mouth every 6 (six) hours. 10/10/16   Maia Plan, MD  oseltamivir (TAMIFLU) 75 MG capsule Take 1 capsule (75 mg total) by mouth every 12 (twelve) hours. 12/01/16   Deatra Canter, FNP  sucralfate (CARAFATE) 1 GM/10ML suspension Take 10 mLs (1 g total) by mouth 4 (four) times daily -  with meals and at bedtime. 10/10/16   Maia Plan, MD   Meds Ordered and Administered this Visit  Medications - No data to display  BP (!) 171/114 (BP Location: Right Wrist)   Pulse 100   Temp 99.4 F (37.4 C) (Oral)   Resp 20   SpO2 100%  No data found.   Physical Exam  Constitutional: She appears well-developed and well-nourished.  HENT:  Head: Normocephalic and atraumatic.  Right Ear: External ear normal.  Left Ear: External ear normal.  Mouth/Throat: Oropharynx is clear and moist.  Eyes: Conjunctivae and EOM are normal. Pupils are equal, round, and reactive to light.  Neck: Normal range of motion. Neck supple.  Cardiovascular: Normal rate, regular rhythm and normal heart sounds.   Pulmonary/Chest: Effort normal and breath sounds normal.  Abdominal: Soft. Bowel sounds are normal.  Nursing note and vitals reviewed.   Urgent Care Course  Procedures (including critical care time)  Labs Review Labs Reviewed - No data to display  Imaging Review No results found.   Visual Acuity Review  Right Eye Distance:   Left Eye Distance:   Bilateral Distance:    Right Eye Near:   Left Eye Near:    Bilateral Near:         MDM   1. Influenza-like illness   2. Cough    Tamiflu Tessalon  Push po fluids, rest, tylenol and motrin otc prn as directed for fever, arthralgias, and myalgias.  Follow up prn if sx's continue or persist.    Deatra CanterWilliam J Kalle Bernath, FNP 12/01/16 [phone]

## 2016-12-01 NOTE — ED Triage Notes (Signed)
Pt c/o cold sx onset: 5 days  Sx include: vomiting, nauseas, HA, chills, BA, cough, fevers  Taking: OTC cold meds w/temp relief.   A&O x4... NAD

## 2017-03-23 ENCOUNTER — Ambulatory Visit (HOSPITAL_COMMUNITY)
Admission: EM | Admit: 2017-03-23 | Discharge: 2017-03-23 | Disposition: A | Discharge status: Home / Self Care | Payer: 59 | Attending: Internal Medicine | Admitting: Internal Medicine

## 2017-03-23 ENCOUNTER — Encounter (HOSPITAL_COMMUNITY): Payer: Self-pay

## 2017-03-23 DIAGNOSIS — J301 Allergic rhinitis due to pollen: Secondary | ICD-10-CM | POA: Insufficient documentation

## 2017-03-23 DIAGNOSIS — Z79899 Other long term (current) drug therapy: Secondary | ICD-10-CM | POA: Insufficient documentation

## 2017-03-23 DIAGNOSIS — Z833 Family history of diabetes mellitus: Secondary | ICD-10-CM | POA: Insufficient documentation

## 2017-03-23 DIAGNOSIS — J029 Acute pharyngitis, unspecified: Secondary | ICD-10-CM | POA: Diagnosis present

## 2017-03-23 DIAGNOSIS — Z8249 Family history of ischemic heart disease and other diseases of the circulatory system: Secondary | ICD-10-CM | POA: Insufficient documentation

## 2017-03-23 LAB — POCT RAPID STREP A: STREPTOCOCCUS, GROUP A SCREEN (DIRECT): NEGATIVE

## 2017-03-23 NOTE — ED Provider Notes (Signed)
CSN: [id number]     Arrival date & time 03/23/17  1902 History   First MD Initiated Contact with Patient 03/23/17 1953     Chief Complaint  Patient presents with  . Sore Throat   (Consider location/radiation/quality/duration/timing/severity/associated sxs/prior Treatment) 31 year old female complaining of sore throat feeling like knives are in her throat and is painful to swallow. This started at 3 days ago. Associated with PND, runny nose, sneezing, nasal congestion. She states that yesterday her temperature is 102.1 degrees.      Past Medical History:  Diagnosis Date  . Scoliosis    Past Surgical History:  Procedure Laterality Date  . BACK SURGERY    . CESAREAN SECTION    . FIXATION KYPHOPLASTY     Family History  Problem Relation Age of Onset  . Hypertension Mother   . Diabetes Mother    Social History  Substance Use Topics  . Smoking status: Never Smoker  . Smokeless tobacco: Never Used  . Alcohol use Yes     Comment: socially   OB History    No data available     Review of Systems  Constitutional: Negative.   HENT: Positive for sore throat, trouble swallowing and voice change.        As per history of present illness  Eyes: Negative.   Respiratory: Negative.   Neurological: Negative.   All other systems reviewed and are negative.   Allergies  Patient has no known allergies.  Home Medications   Prior to Admission medications   Medication Sig Start Date End Date Taking? Authorizing Provider  spironolactone (ALDACTONE) 25 MG tablet Take 25 mg by mouth daily.   Yes [provider]  aspirin EC 81 MG tablet Take 162 mg by mouth every 4 (four) hours as needed for mild pain.    [provider]  benzonatate (TESSALON) 100 MG capsule Take 1 capsule (100 mg total) by mouth every 8 (eight) hours. 12/01/16   Deatra Canter, FNP  dicyclomine (BENTYL) 20 MG tablet Take 1 tablet (20 mg total) by mouth 2 (two) times daily. 10/10/16   Long, Arlyss Repress,  MD  ondansetron (ZOFRAN) 4 MG tablet Take 1 tablet (4 mg total) by mouth every 6 (six) hours. 10/10/16   Long, Arlyss Repress, MD  oseltamivir (TAMIFLU) 75 MG capsule Take 1 capsule (75 mg total) by mouth every 12 (twelve) hours. 12/01/16   Deatra Canter, FNP  sucralfate (CARAFATE) 1 GM/10ML suspension Take 10 mLs (1 g total) by mouth 4 (four) times daily -  with meals and at bedtime. 10/10/16   Long, Arlyss Repress, MD   Meds Ordered and Administered this Visit  Medications - No data to display  BP (!) 107/52 (BP Location: Right Arm)   Pulse 92   Temp 98.9 F (37.2 C) (Oral)   Resp 20   SpO2 97%  No data found.   Physical Exam  Constitutional: She is oriented to person, place, and time. She appears well-developed and well-nourished. No distress.  HENT:  Mouth/Throat: No oropharyngeal exudate.  Bilateral TMs are normal. Oropharynx is pink, no appreciable erythema, no swelling, no exudates no tonsillar enlargement    Eyes: EOM are normal.  Neck: Normal range of motion. Neck supple.  Cardiovascular: Normal rate, regular rhythm and normal heart sounds.   Pulmonary/Chest: Effort normal and breath sounds normal. No respiratory distress. She has no wheezes.  Musculoskeletal: Normal range of motion. She exhibits no edema.  Lymphadenopathy:    She has  no cervical adenopathy.  Neurological: She is alert and oriented to person, place, and time.  Skin: Skin is warm and dry. No rash noted.  Psychiatric: She has a normal mood and affect.  Nursing note and vitals reviewed.   Urgent Care Course     Procedures (including critical care time)  Labs Review Labs Reviewed  POCT RAPID STREP A    Imaging Review No results found.   Visual Acuity Review  Right Eye Distance:   Left Eye Distance:   Bilateral Distance:    Right Eye Near:   Left Eye Near:    Bilateral Near:         MDM   1. Pharyngitis, unspecified etiology   2. Sore throat   3. Allergic rhinitis due to pollen,  unspecified seasonality    Strep test is negative. Throat looks generally well with the exception of drainage. It is likely this is causing irritation and pain to the mucosa of your throat. Drink lots of fluids especially water before you go to bed and upon awakening. Cepacol lozenges for sore throat pain ibuprofen 600 mg every 6 hours as needed. May also take, Zyrtec, Allegra for drainage. This will also help with sneezing and runny nose. If the throat culture turns out to be positive for strep he will be called and treated over the phone.     Hayden RasmussenMabe, Hadyn Blanck, NP 03/23/17 2012

## 2017-03-23 NOTE — ED Triage Notes (Signed)
Pt here for strep throat x 3days. Also having headache and congestion. Did take dayquil and throat spray without relief. Fever of 102.1 yesterday.

## 2017-03-23 NOTE — Discharge Instructions (Signed)
Strep test is negative. Throat looks generally well with the exception of drainage. It is likely this is causing irritation and pain to the mucosa of your throat. Drink lots of fluids especially water before you go to bed and upon awakening. Cepacol lozenges for sore throat pain ibuprofen 600 mg every 6 hours as needed. May also take, Zyrtec, Allegra for drainage. This will also help with sneezing and runny nose. If the throat culture turns out to be positive for strep he will be called and treated over the phone.

## 2017-03-26 LAB — CULTURE, GROUP A STREP (THRC)

## 2017-04-19 ENCOUNTER — Other Ambulatory Visit (HOSPITAL_COMMUNITY): Payer: Self-pay | Admitting: Surgery

## 2017-04-24 ENCOUNTER — Encounter: Payer: Self-pay | Admitting: Registered"

## 2017-04-24 ENCOUNTER — Encounter: Payer: 59 | Attending: Surgery | Admitting: Registered"

## 2017-04-24 DIAGNOSIS — Z79899 Other long term (current) drug therapy: Secondary | ICD-10-CM | POA: Insufficient documentation

## 2017-04-24 DIAGNOSIS — E7800 Pure hypercholesterolemia, unspecified: Secondary | ICD-10-CM | POA: Diagnosis not present

## 2017-04-24 DIAGNOSIS — Z6841 Body Mass Index (BMI) 40.0 and over, adult: Secondary | ICD-10-CM | POA: Insufficient documentation

## 2017-04-24 DIAGNOSIS — Z713 Dietary counseling and surveillance: Secondary | ICD-10-CM | POA: Diagnosis not present

## 2017-04-24 DIAGNOSIS — R030 Elevated blood-pressure reading, without diagnosis of hypertension: Secondary | ICD-10-CM | POA: Insufficient documentation

## 2017-04-24 DIAGNOSIS — E669 Obesity, unspecified: Secondary | ICD-10-CM

## 2017-04-24 NOTE — Progress Notes (Signed)
Pre-Op Assessment Visit:  Pre-Operative Sleeve Gastrectomy Surgery  Medical Nutrition Therapy:  Appt start time: 2:05  End time:  3:05  Patient was seen on 04/24/2017 for Pre-Operative Nutrition Assessment. [facility] on 04/24/2017.   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 Pt states she only drinks water (4 bottles daily); no sodas and juice.  Per insurance, pt needs 3 SWL visits prior to surgery.    24 hr Dietary Recall: First Meal: fast food or chicken/fish, vegetables (spinach, green beans, corn, asparagus) Snack: blueberry muffin or veggie chips or peanut butter crackers Second Meal: salad or chicken/fish, vegetables Snack: none Third Meal: sometimes skips; fast food-biscuit Snack: none Beverages: water  Encouraged to engage in 150 minutes of moderate physical activity including cardiovascular and weight baring weekly  Handouts given during visit include:  . Pre-Op Goals . Bariatric Surgery Protein Shakes . Vitamin and Mineral Recommendations  During the appointment today the following Pre-Op Goals were reviewed with the patient: . Maintain or lose weight as instructed by your surgeon . Make healthy food choices . Begin to limit portion sizes . Limited concentrated sugars and fried foods . Keep fat/sugar in the single digits per serving on          food labels . Practice CHEWING your food  (aim for 30 chews per bite or until applesauce consistency) . Practice not drinking 15 minutes before, during, and 30 minutes after each meal/snack . Avoid all carbonated beverages  . Avoid/limit caffeinated beverages  . Avoid all sugar-sweetened beverages . Consume 3 meals per day; eat every  3-5 hours . Make a list of non-food related activities . Aim for 64-100 ounces of FLUID daily  . Aim for at least 60-80 grams of PROTEIN daily . Look for a liquid protein source that contain ?15 g protein and ?5 g carbohydrate  (ex: shakes, drinks, shots) . Physical activity is an important part of a healthy lifestyle so keep it moving!  Follow diet recommendations listed below Energy and Macronutrient Recommendations: Calories: 1800 Carbohydrate: 200 Protein: 135 Fat: 50  Demonstrated degree of understanding via:  Teach Back   Teaching Method Utilized:  Visual Auditory Hands on  Barriers to learning/adherence to lifestyle change: none  Patient to call the Nutrition and Diabetes Education Services to enroll in Pre-Op and Post-Op Nutrition Education when surgery date is scheduled.

## 2017-05-14 ENCOUNTER — Ambulatory Visit (HOSPITAL_COMMUNITY): Payer: 59

## 2017-05-14 ENCOUNTER — Encounter (HOSPITAL_COMMUNITY): Payer: Self-pay

## 2017-05-14 ENCOUNTER — Other Ambulatory Visit (HOSPITAL_COMMUNITY): Payer: Self-pay | Admitting: Surgery

## 2017-05-14 DIAGNOSIS — E669 Obesity, unspecified: Secondary | ICD-10-CM

## 2017-05-24 ENCOUNTER — Encounter: Payer: Self-pay | Admitting: Registered"

## 2017-05-24 ENCOUNTER — Encounter: Payer: 59 | Attending: Surgery | Admitting: Registered"

## 2017-05-24 DIAGNOSIS — Z713 Dietary counseling and surveillance: Secondary | ICD-10-CM | POA: Insufficient documentation

## 2017-05-24 DIAGNOSIS — R030 Elevated blood-pressure reading, without diagnosis of hypertension: Secondary | ICD-10-CM | POA: Diagnosis not present

## 2017-05-24 DIAGNOSIS — Z6841 Body Mass Index (BMI) 40.0 and over, adult: Secondary | ICD-10-CM | POA: Insufficient documentation

## 2017-05-24 DIAGNOSIS — E7800 Pure hypercholesterolemia, unspecified: Secondary | ICD-10-CM | POA: Diagnosis not present

## 2017-05-24 DIAGNOSIS — E669 Obesity, unspecified: Secondary | ICD-10-CM

## 2017-05-24 DIAGNOSIS — Z79899 Other long term (current) drug therapy: Secondary | ICD-10-CM | POA: Insufficient documentation

## 2017-05-24 NOTE — Patient Instructions (Addendum)
-   Look for a liquid protein source that contain ?15 g protein and ?5 g carbohydrate  (ex: shakes, drinks, shots)  - Create a routine of physical activity 15 min/day 3 days/week.  - Aim to chew at least 30 times per bite or to applesauce consistency.

## 2017-05-24 NOTE — Progress Notes (Signed)
Appt start time: 2:30 end time: 2:45  Assessment: 1st SWL Appointment.   Start Wt at [facility]: 338.4 Wt: 339.1 BMI: 56.43   Pt arrives having maintained weight from last visit. Pt states she has been on vacation 3 times since last month; not eating as well as possible. Pt states she only drinks water and around 64 oz a day. Pt states she will pick up a variety of Premier Protein shakes this weekend to try out.   MEDICATIONS: See list   DIETARY INTAKE:  24-hr recall:  B ( AM): frosted flakes or omelette (sausage and egg) Snk ( AM): none  L ( PM): baked chicken, broccoli Snk ( PM): chips D ( PM): [employer]-salad Snk ( PM): cheez-its, water Beverages: water  Usual physical activity: no  Diet to Follow: 1800 calories 200 g carbohydrates 135 g protein 50 g fat  Preferred Learning Style:   No preference indicated   Learning Readiness:   Ready  Change in progress    Nutritional Diagnosis:  [place]-3.3 Overweight/obesity related to past poor dietary habits and physical inactivity as evidenced by patient w/ planned sleeve gastrectomy surgery following dietary guidelines for continued weight loss.    Intervention:  Nutrition counseling for upcoming Bariatric Surgery.  Goals: - Look for a liquid protein source that contain ?15 g protein and ?5 g carbohydrate  (ex: shakes, drinks, shots) - Create a routine of physical activity 15 min/day 3 days/week. - Aim to chew at least 30 times per bite or to applesauce consistency.  Teaching Method Utilized:  Visual Auditory  Handouts given during visit include:  none  Barriers to learning/adherence to lifestyle change: none  Demonstrated degree of understanding via:  Teach Back   Monitoring/Evaluation:  Dietary intake, exercise, and body weight in 1 month(s).

## 2017-05-28 ENCOUNTER — Inpatient Hospital Stay (HOSPITAL_COMMUNITY): Admission: RE | Admit: 2017-05-28 | Payer: Managed Care, Other (non HMO) | Source: Ambulatory Visit

## 2017-05-28 ENCOUNTER — Ambulatory Visit (HOSPITAL_COMMUNITY)
Admission: RE | Admit: 2017-05-28 | Discharge: 2017-05-28 | Disposition: A | Discharge status: Home / Self Care | Payer: 59 | Source: Ambulatory Visit | Attending: Surgery | Admitting: Surgery

## 2017-05-28 ENCOUNTER — Other Ambulatory Visit: Payer: Self-pay

## 2017-05-28 DIAGNOSIS — Z01810 Encounter for preprocedural cardiovascular examination: Secondary | ICD-10-CM | POA: Diagnosis present

## 2017-05-28 DIAGNOSIS — R9431 Abnormal electrocardiogram [ECG] [EKG]: Secondary | ICD-10-CM | POA: Diagnosis not present

## 2017-06-21 ENCOUNTER — Ambulatory Visit: Payer: 59 | Admitting: Psychiatry

## 2017-06-26 ENCOUNTER — Encounter: Payer: Self-pay | Admitting: Registered"

## 2017-06-26 ENCOUNTER — Encounter: Payer: 59 | Attending: Surgery | Admitting: Registered"

## 2017-06-26 DIAGNOSIS — E7800 Pure hypercholesterolemia, unspecified: Secondary | ICD-10-CM | POA: Diagnosis not present

## 2017-06-26 DIAGNOSIS — R030 Elevated blood-pressure reading, without diagnosis of hypertension: Secondary | ICD-10-CM | POA: Diagnosis not present

## 2017-06-26 DIAGNOSIS — Z6841 Body Mass Index (BMI) 40.0 and over, adult: Secondary | ICD-10-CM | POA: Insufficient documentation

## 2017-06-26 DIAGNOSIS — Z713 Dietary counseling and surveillance: Secondary | ICD-10-CM | POA: Insufficient documentation

## 2017-06-26 DIAGNOSIS — E669 Obesity, unspecified: Secondary | ICD-10-CM

## 2017-06-26 DIAGNOSIS — Z79899 Other long term (current) drug therapy: Secondary | ICD-10-CM | POA: Insufficient documentation

## 2017-06-26 NOTE — Patient Instructions (Addendum)
-   Add in snack before going to work and have one at midnight during break as well.   - Continue to practice CHEWING your food (aim for 30 chews per bite or until applesauce consistency).  - Continue to practice not drinking 15 minutes before, during, and 30 minutes after each meal/snack.  - Create a routine of physical activity 15 min/day 3 days/week.  - Check [employer] App.

## 2017-06-26 NOTE — Progress Notes (Signed)
Appt start time: 2:30 end time: 2:55  Assessment: 2nd SWL Appointment.   Start Wt at [facility]: 338.4 Wt: 339.1 BMI: 56.43   Pt arrives having maintained weight from last visit. Pt states she has tried several protein shake options and likes Premier protein (vanilla). Pt states she does not like chocolate or sweet flavors. Pt states she works 3rd shift and eats 6:30am, 3pm with son, 12am at work, and 2:30am at work. Pt states she does not eat burgers because it makes her nauseous.   Pt has 1 more SWL visit with us prior to surgery.    MEDICATIONS: See list   DIETARY INTAKE:  24-hr recall:  B (6:30 AM): Protein shake  Snk (AM): none  L (3 PM): Chicfila-chicken strips or baked chicken, broccoli Snk (12 AM): veggie chips D (2-3 AM): McDonald's or Elizabeth[employer]-salad or chicken wrap Snk (AM): none Beverages: water  Usual physical activity: no  Diet to Follow: 1800 calories 200 g carbohydrates 135 g protein 50 g fat  Preferred Learning Style:   No preference indicated   Learning Readiness:   Ready  Change in progress    Nutritional Diagnosis:  -3.3 Overweight/obesity related to past poor dietary habits and physical inactivity as evidenced by patient w/ planned sleeve gastrectomy surgery following dietary guidelines for continued weight loss.    Intervention:  Nutrition counseling for upcoming Bariatric Surgery.  Goals: - Add in snack before going to work and have one at midnight during break as well.  - Continue to practice CHEWING your food (aim for 30 chews per bite or until applesauce consistency). - Continue to practice not drinking 15 minutes before, during, and 30 minutes after each meal/snack. - Create a routine of physical activity 15 min/day 3 days/week. - Check Baritastic App.  Teaching Method Utilized:  Visual Auditory  Handouts given during visit include:  none  Barriers to learning/adherence to lifestyle change: none  Demonstrated degree of  understanding via:  Teach Back   Monitoring/Evaluation:  Dietary intake, exercise, and body weight in 1 month(s).

## 2017-07-24 ENCOUNTER — Encounter: Payer: Self-pay | Admitting: Registered"

## 2017-07-24 ENCOUNTER — Encounter: Payer: 59 | Attending: Surgery | Admitting: Registered"

## 2017-07-24 DIAGNOSIS — E7800 Pure hypercholesterolemia, unspecified: Secondary | ICD-10-CM | POA: Insufficient documentation

## 2017-07-24 DIAGNOSIS — R030 Elevated blood-pressure reading, without diagnosis of hypertension: Secondary | ICD-10-CM | POA: Diagnosis not present

## 2017-07-24 DIAGNOSIS — Z6841 Body Mass Index (BMI) 40.0 and over, adult: Secondary | ICD-10-CM | POA: Diagnosis not present

## 2017-07-24 DIAGNOSIS — E669 Obesity, unspecified: Secondary | ICD-10-CM

## 2017-07-24 DIAGNOSIS — Z79899 Other long term (current) drug therapy: Secondary | ICD-10-CM | POA: Diagnosis not present

## 2017-07-24 DIAGNOSIS — Z713 Dietary counseling and surveillance: Secondary | ICD-10-CM | POA: Insufficient documentation

## 2017-07-24 NOTE — Patient Instructions (Addendum)
-   Continue to aim to wait 30 min after eating to drink.   - Check Baritastic App.   - Continue 10-15 min, 5 days/week after school with son around the neighborhood.   - Take Multivitamin Complete with iron.

## 2017-07-24 NOTE — Progress Notes (Signed)
Appt start time: 3:10 end time: 3:27  Assessment: 2nd SWL Appointment.   Start Wt at [facility]: 338.4 Wt: 339.3 BMI: 56.46   Pt arrives having maintained weight from last visit. Pt states she is waiting on psychology evaluation in Nov. Pt states she has added in snack option before work. Pt states she is doing better with reading nutrition facts label. Pt states she is still working on chewing; up to about 20-25 times per bite.   This is pt's last SWL visit with [name] prior to surgery.    MEDICATIONS: See list   DIETARY INTAKE:  24-hr recall:  B (8 AM): Protein shake  Snk (AM): none  L (3:30-4PM): Chic[employer]a-chicken strips or baked chicken, broccoli Snk (9:15 PM): veggie chips or greek yogurt or protein pack with cheese, nuts, and dried fruit Snk (12 AM): veggie chips D (2-3 AM): McDonald's or Elizabeth[employer]-salad or chicken wrap Snk (AM): none Beverages: water  Usual physical activity: walking 15-20 min, 5 days/week   Diet to Follow: 1800 calories 200 g carbohydrates 135 g protein 50 g fat  Preferred Learning Style:   No preference indicated   Learning Readiness:   Ready  Change in progress    Nutritional Diagnosis:  [place]-3.3 Overweight/obesity related to past poor dietary habits and physical inactivity as evidenced by patient w/ planned sleeve gastrectomy surgery following dietary guidelines for continued weight loss.    Intervention:  Nutrition counseling for upcoming Bariatric Surgery.  Goals: - Continue to aim to wait 30 min after eating to drink.  - Check Baritastic App.  - Continue 10-15 min, 5 days/week after school with son around the neighborhood.  - Take Multivitamin Complete with iron.  Teaching Method Utilized:  Visual Auditory  Handouts given during visit include:  none  Barriers to learning/adherence to lifestyle change: none  Demonstrated degree of understanding via:  Teach Back   Monitoring/Evaluation:  Dietary intake, exercise, and body weight  in 1 month(s).

## 2017-08-13 ENCOUNTER — Encounter: Payer: 59 | Admitting: Skilled Nursing Facility1

## 2017-08-13 DIAGNOSIS — E669 Obesity, unspecified: Secondary | ICD-10-CM

## 2017-08-14 ENCOUNTER — Encounter: Payer: Self-pay | Admitting: Skilled Nursing Facility1

## 2017-08-14 NOTE — Progress Notes (Signed)
Pre-Operative Nutrition Class:  Appt start time: 4718   End time:  1830.  Patient was seen on 08/13/2017 for Pre-Operative Bariatric Surgery Education at [facility].   Surgery date:  Surgery type: sleeve Start weight at [facility]: 338 Weight today: 341.7  Samples given per MNT protocol. Patient educated on appropriate usage: Lot # [id number] Exp: 07/19  Bariatric Advantage Calcium  Lot # [id number] Exp: sep-13-2019  Renee Pain Protein Shake Lot # [id number] Exp: may-27-19  The following the learning objectives were met by the patient during this course:  Identify Pre-Op Dietary Goals and will begin 2 weeks pre-operatively  Identify appropriate sources of fluids and proteins   State protein recommendations and appropriate sources pre and post-operatively  Identify Post-Operative Dietary Goals and will follow for 2 weeks post-operatively  Identify appropriate multivitamin and calcium sources  Describe the need for physical activity post-operatively and will follow MD recommendations  State when to call healthcare provider regarding medication questions or post-operative complications  Handouts given during class include:  Pre-Op Bariatric Surgery Diet Handout  Protein Shake Handout  Post-Op Bariatric Surgery Nutrition Handout  [facility] Program Information Flyer  Support Group Information Flyer  [facility] Bariatric Supplements Price List  Follow-Up Plan: [facility] for diet advancement per MD.

## 2017-09-04 ENCOUNTER — Ambulatory Visit (INDEPENDENT_AMBULATORY_CARE_PROVIDER_SITE_OTHER): Payer: 59 | Admitting: Psychiatry

## 2017-09-04 DIAGNOSIS — F509 Eating disorder, unspecified: Secondary | ICD-10-CM

## 2017-09-12 ENCOUNTER — Ambulatory Visit (INDEPENDENT_AMBULATORY_CARE_PROVIDER_SITE_OTHER): Payer: 59 | Admitting: Psychiatry

## 2017-09-12 DIAGNOSIS — F509 Eating disorder, unspecified: Secondary | ICD-10-CM | POA: Diagnosis not present

## 2017-09-27 ENCOUNTER — Ambulatory Visit: Payer: Self-pay | Admitting: Surgery

## 2017-09-27 NOTE — H&P (View-Only) (Signed)
Chief Complaint:  Morbid obesity BMI57  History of Present Illness:  Cathy Yang is an 31 y.o. female who has been evaluated in the [facility] program and presents for sleeve gastrectomy.  She denies GER but she has small intermittent hiatal hernia on UGI.  She is ready for sleeve gastrectomy  Past Medical History:  Diagnosis Date  . Hypertension   . Scoliosis     Past Surgical History:  Procedure Laterality Date  . BACK SURGERY    . CESAREAN SECTION    . FIXATION KYPHOPLASTY      Current Outpatient Medications  Medication Sig Dispense Refill  . aspirin EC 81 MG tablet Take 162 mg by mouth every 4 (four) hours as needed for mild pain.    . benzonatate (TESSALON) 100 MG capsule Take 1 capsule (100 mg total) by mouth every 8 (eight) hours. (Patient not taking: Reported on 04/24/2017) 21 capsule 0  . buPROPion (WELLBUTRIN) 100 MG tablet Take 100 mg by mouth 2 (two) times daily.    . cholecalciferol (VITAMIN D) 1000 units tablet Take 50,000 Units by mouth daily.    Marland Kitchen. dicyclomine (BENTYL) 20 MG tablet Take 1 tablet (20 mg total) by mouth 2 (two) times daily. (Patient not taking: Reported on 04/24/2017) 20 tablet 0  . ondansetron (ZOFRAN) 4 MG tablet Take 1 tablet (4 mg total) by mouth every 6 (six) hours. (Patient not taking: Reported on 04/24/2017) 12 tablet 0  . oseltamivir (TAMIFLU) 75 MG capsule Take 1 capsule (75 mg total) by mouth every 12 (twelve) hours. (Patient not taking: Reported on 04/24/2017) 10 capsule 0  . spironolactone (ALDACTONE) 25 MG tablet Take 25 mg by mouth daily.    . sucralfate (CARAFATE) 1 GM/10ML suspension Take 10 mLs (1 g total) by mouth 4 (four) times daily -  with meals and at bedtime. (Patient not taking: Reported on 04/24/2017) 420 mL 0   No current facility-administered medications for this visit.    Patient has no known allergies. Family History  Problem Relation Age of Onset  . Hypertension Mother   . Diabetes Mother   . Cancer Other    Social History:   reports  that  has never smoked. she has never used smokeless tobacco. She reports that she drinks alcohol. She reports that she does not use drugs.   REVIEW OF SYSTEMS : Negative except for see problem list  Physical Exam:   There were no vitals taken for this visit. There is no height or weight on file to calculate BMI.  Gen:  WDWN AAF NAD  Neurological: Alert and oriented to person, place, and time. Motor and sensory function is grossly intact  Head: Normocephalic and atraumatic.  Eyes: Conjunctivae are normal. Pupils are equal, round, and reactive to light. No scleral icterus.  Neck: Normal range of motion. Neck supple. No tracheal deviation or thyromegaly present.  Cardiovascular:  SR without murmurs or gallops.  No carotid bruits Breast:  Not examined Respiratory: Effort normal.  No respiratory distress. No chest wall tenderness. Breath sounds normal.  No wheezes, rales or rhonchi.  Abdomen:  nontender GU:  Not examined Musculoskeletal: Normal range of motion. Extremities are nontender. No cyanosis, edema or clubbing noted Lymphadenopathy: No cervical, preauricular, postauricular or axillary adenopathy is present Skin: Skin is warm and dry. No rash noted. No diaphoresis. No erythema. No pallor. Pscyh: Normal mood and affect. Behavior is normal. Judgment and thought content normal.   LABORATORY RESULTS: No results found for this or any previous  visit (from the past 48 hour(s)).   RADIOLOGY RESULTS: No results found.  Problem List: There are no active problems to display for this patient.   Assessment & Plan: Obesity with BMI 57  Plan sleeve gastrectomy    Matt B. Daphine DeutscherMartin, MD, [facility]  [facility] [phone] beeper [phone]  09/27/2017 10:44 AM

## 2017-09-27 NOTE — H&P (Signed)
Chief Complaint:  Morbid obesity BMI57  History of Present Illness:  Cathy Yang is an 31 y.o. female who has been evaluated in the [facility] program and presents for sleeve gastrectomy.  She denies GER but she has small intermittent hiatal hernia on UGI.  She is ready for sleeve gastrectomy  Past Medical History:  Diagnosis Date  . Hypertension   . Scoliosis     Past Surgical History:  Procedure Laterality Date  . BACK SURGERY    . CESAREAN SECTION    . FIXATION KYPHOPLASTY      Current Outpatient Medications  Medication Sig Dispense Refill  . aspirin EC 81 MG tablet Take 162 mg by mouth every 4 (four) hours as needed for mild pain.    . benzonatate (TESSALON) 100 MG capsule Take 1 capsule (100 mg total) by mouth every 8 (eight) hours. (Patient not taking: Reported on 04/24/2017) 21 capsule 0  . buPROPion (WELLBUTRIN) 100 MG tablet Take 100 mg by mouth 2 (two) times daily.    . cholecalciferol (VITAMIN D) 1000 units tablet Take 50,000 Units by mouth daily.    . dicyclomine (BENTYL) 20 MG tablet Take 1 tablet (20 mg total) by mouth 2 (two) times daily. (Patient not taking: Reported on 04/24/2017) 20 tablet 0  . ondansetron (ZOFRAN) 4 MG tablet Take 1 tablet (4 mg total) by mouth every 6 (six) hours. (Patient not taking: Reported on 04/24/2017) 12 tablet 0  . oseltamivir (TAMIFLU) 75 MG capsule Take 1 capsule (75 mg total) by mouth every 12 (twelve) hours. (Patient not taking: Reported on 04/24/2017) 10 capsule 0  . spironolactone (ALDACTONE) 25 MG tablet Take 25 mg by mouth daily.    . sucralfate (CARAFATE) 1 GM/10ML suspension Take 10 mLs (1 g total) by mouth 4 (four) times daily -  with meals and at bedtime. (Patient not taking: Reported on 04/24/2017) 420 mL 0   No current facility-administered medications for this visit.    Patient has no known allergies. Family History  Problem Relation Age of Onset  . Hypertension Mother   . Diabetes Mother   . Cancer Other    Social History:   reports  that  has never smoked. she has never used smokeless tobacco. She reports that she drinks alcohol. She reports that she does not use drugs.   REVIEW OF SYSTEMS : Negative except for see problem list  Physical Exam:   There were no vitals taken for this visit. There is no height or weight on file to calculate BMI.  Gen:  WDWN AAF NAD  Neurological: Alert and oriented to person, place, and time. Motor and sensory function is grossly intact  Head: Normocephalic and atraumatic.  Eyes: Conjunctivae are normal. Pupils are equal, round, and reactive to light. No scleral icterus.  Neck: Normal range of motion. Neck supple. No tracheal deviation or thyromegaly present.  Cardiovascular:  SR without murmurs or gallops.  No carotid bruits Breast:  Not examined Respiratory: Effort normal.  No respiratory distress. No chest wall tenderness. Breath sounds normal.  No wheezes, rales or rhonchi.  Abdomen:  nontender GU:  Not examined Musculoskeletal: Normal range of motion. Extremities are nontender. No cyanosis, edema or clubbing noted Lymphadenopathy: No cervical, preauricular, postauricular or axillary adenopathy is present Skin: Skin is warm and dry. No rash noted. No diaphoresis. No erythema. No pallor. Pscyh: Normal mood and affect. Behavior is normal. Judgment and thought content normal.   LABORATORY RESULTS: No results found for this or any previous   visit (from the past 48 hour(s)).   RADIOLOGY RESULTS: No results found.  Problem List: There are no active problems to display for this patient.   Assessment & Plan: Obesity with BMI 57  Plan sleeve gastrectomy    Matt B. Daphine DeutscherMartin, MD, [facility]  [facility] [phone] beeper [phone]  09/27/2017 10:44 AM

## 2017-10-02 NOTE — Progress Notes (Signed)
ekg 05-28-17 epic  cxr 05-28-17 epic

## 2017-10-02 NOTE — Patient Instructions (Addendum)
Cathy Yang  10/02/2017   Your procedure is scheduled on: 10-08-17  Report to [facility] Main  Entrance [place]  elevators to 3rd floor to  Short Stay Center at     1040 AM.    Call this number if you have problems the morning of surgery [phone]    Remember: ONLY 1 PERSON MAY GO WITH YOU TO SHORT STAY TO GET  READY MORNING OF YOUR SURGERY.  Do not eat food or drink liquids :After Midnight.     Take these medicines the morning of surgery with A SIP OF WATER: none                                You may not have any metal on your body including hair pins and              piercings  Do not wear jewelry, make-up, lotions, powders or perfumes, deodorant             Do not wear nail polish.  Do not shave  48 hours prior to surgery.             Do not bring valuables to the hospital. [facility] IS NOT             RESPONSIBLE   FOR VALUABLES.  Contacts, dentures or bridgework may not be worn into surgery.  Leave suitcase in the car. After surgery it may be brought to your room.                 Please read over the following fact sheets you were given: _____________________________________________________________________          [facility] Before surgery, you can play an important role.  Because skin is not sterile, your skin needs to be as free of germs as possible.  You can reduce the number of germs on your skin by washing with CHG (chlorahexidine gluconate) soap before surgery.  CHG is an antiseptic cleaner which kills germs and bonds with the skin to continue killing germs even after washing. Please DO NOT use if you have an allergy to CHG or antibacterial soaps.  If your skin becomes reddened/irritated stop using the CHG and inform your nurse when you arrive at Short Stay. Do not shave (including legs and underarms) for at least 48 hours prior to the first CHG shower.  You may shave your face/neck. Please follow these  instructions carefully:  1.  Shower with CHG Soap the night before surgery and the  morning of Surgery.  2.  If you choose to wash your hair, wash your hair first as usual with your  normal  shampoo.  3.  After you shampoo, rinse your hair and body thoroughly to remove the  shampoo.                           4.  Use CHG as you would any other liquid soap.  You can apply chg directly  to the skin and wash                       Gently with a scrungie or clean washcloth.  5.  Apply the CHG Soap to your body ONLY FROM THE NECK  DOWN.   Do not use on face/ open                           Wound or open sores. Avoid contact with eyes, ears mouth and genitals (private parts).                       Wash face,  Genitals (private parts) with your normal soap.             6.  Wash thoroughly, paying special attention to the area where your surgery  will be performed.  7.  Thoroughly rinse your body with warm water from the neck down.  8.  DO NOT shower/wash with your normal soap after using and rinsing off  the CHG Soap.                9.  Pat yourself dry with a clean towel.            10.  Wear clean pajamas.            11.  Place clean sheets on your bed the night of your first shower and do not  sleep with pets. Day of Surgery : Do not apply any lotions/deodorants the morning of surgery.  Please wear clean clothes to the hospital/surgery center.  FAILURE TO FOLLOW THESE INSTRUCTIONS MAY RESULT IN THE CANCELLATION OF YOUR SURGERY PATIENT SIGNATURE_________________________________  NURSE SIGNATURE__________________________________  ________________________________________________________________________  WHAT IS A BLOOD TRANSFUSION? Blood Transfusion Information  A transfusion is the replacement of blood or some of its parts. Blood is made up of multiple cells which provide different functions.  Red blood cells carry oxygen and are used for blood loss replacement.  White blood cells fight against  infection.  Platelets control bleeding.  Plasma helps clot blood.  Other blood products are available for specialized needs, such as hemophilia or other clotting disorders. BEFORE THE TRANSFUSION  Who gives blood for transfusions?   Healthy volunteers who are fully evaluated to make sure their blood is safe. This is blood bank blood. Transfusion therapy is the safest it has ever been in the practice of medicine. Before blood is taken from a donor, a complete history is taken to make sure that person has no history of diseases nor engages in risky social behavior (examples are intravenous drug use or sexual activity with multiple partners). The donor's travel history is screened to minimize risk of transmitting infections, such as malaria. The donated blood is tested for signs of infectious diseases, such as HIV and hepatitis. The blood is then tested to be sure it is compatible with you in order to minimize the chance of a transfusion reaction. If you or a relative donates blood, this is often done in anticipation of surgery and is not appropriate for emergency situations. It takes many days to process the donated blood. RISKS AND COMPLICATIONS Although transfusion therapy is very safe and saves many lives, the main dangers of transfusion include:   Getting an infectious disease.  Developing a transfusion reaction. This is an allergic reaction to something in the blood you were given. Every precaution is taken to prevent this. The decision to have a blood transfusion has been considered carefully by your caregiver before blood is given. Blood is not given unless the benefits outweigh the risks. AFTER THE TRANSFUSION  Right after receiving a blood transfusion, you will usually feel much better and more energetic.  This is especially true if your red blood cells have gotten low (anemic). The transfusion raises the level of the red blood cells which carry oxygen, and this usually causes an energy  increase.  The nurse administering the transfusion will monitor you carefully for complications. HOME CARE INSTRUCTIONS  No special instructions are needed after a transfusion. You may find your energy is better. Speak with your caregiver about any limitations on activity for underlying diseases you may have. SEEK MEDICAL CARE IF:   Your condition is not improving after your transfusion.  You develop redness or irritation at the intravenous (IV) site. SEEK IMMEDIATE MEDICAL CARE IF:  Any of the following symptoms occur over the next 12 hours:  Shaking chills.  You have a temperature by mouth above 102 F (38.9 C), not controlled by medicine.  Chest, back, or muscle pain.  People around you feel you are not acting correctly or are confused.  Shortness of breath or difficulty breathing.  Dizziness and fainting.  You get a rash or develop hives.  You have a decrease in urine output.  Your urine turns a dark color or changes to pink, red, or brown. Any of the following symptoms occur over the next 10 days:  You have a temperature by mouth above 102 F (38.9 C), not controlled by medicine.  Shortness of breath.  Weakness after normal activity.  The white part of the eye turns yellow (jaundice).  You have a decrease in the amount of urine or are urinating less often.  Your urine turns a dark color or changes to pink, red, or brown. Document Released: 10/06/2000 Document Revised: 01/01/2012 Document Reviewed: 05/25/2008 ExitCare Patient Information 2014 [place].  _______________________________________________________________________  Incentive Spirometer  An incentive spirometer is a tool that can help keep your lungs clear and active. This tool measures how well you are filling your lungs with each breath. Taking long deep breaths may help reverse or decrease the chance of developing breathing (pulmonary) problems (especially infection) following:  A long  period of time when you are unable to move or be active. BEFORE THE PROCEDURE   If the spirometer includes an indicator to show your best effort, your nurse or respiratory therapist will set it to a desired goal.  If possible, sit up straight or lean slightly forward. Try not to slouch.  Hold the incentive spirometer in an upright position. INSTRUCTIONS FOR USE  1. Sit on the edge of your bed if possible, or sit up as far as you can in bed or on a chair. 2. Hold the incentive spirometer in an upright position. 3. Breathe out normally. 4. Place the mouthpiece in your mouth and seal your lips tightly around it. 5. Breathe in slowly and as deeply as possible, raising the piston or the ball toward the top of the column. 6. Hold your breath for 3-5 seconds or for as long as possible. Allow the piston or ball to fall to the bottom of the column. 7. Remove the mouthpiece from your mouth and breathe out normally. 8. Rest for a few seconds and repeat Steps 1 through 7 at least 10 times every 1-2 hours when you are awake. Take your time and take a few normal breaths between deep breaths. 9. The spirometer may include an indicator to show your best effort. Use the indicator as a goal to work toward during each repetition. 10. After each set of 10 deep breaths, practice coughing to be sure your lungs are clear.  If you have an incision (the cut made at the time of surgery), support your incision when coughing by placing a pillow or rolled up towels firmly against it. Once you are able to get out of bed, walk around indoors and cough well. You may stop using the incentive spirometer when instructed by your caregiver.  RISKS AND COMPLICATIONS  Take your time so you do not get dizzy or light-headed.  If you are in pain, you may need to take or ask for pain medication before doing incentive spirometry. It is harder to take a deep breath if you are having pain. AFTER USE  Rest and breathe slowly and  easily.  It can be helpful to keep track of a log of your progress. Your caregiver can provide you with a simple table to help with this. If you are using the spirometer at home, follow these instructions: [place] IF:   You are having difficultly using the spirometer.  You have trouble using the spirometer as often as instructed.  Your pain medication is not giving enough relief while using the spirometer.  You develop fever of 100.5 F (38.1 C) or higher. SEEK IMMEDIATE MEDICAL CARE IF:   You cough up bloody sputum that had not been present before.  You develop fever of 102 F (38.9 C) or greater.  You develop worsening pain at or near the incision site. MAKE SURE YOU:   Understand these instructions.  Will watch your condition.  Will get help right away if you are not doing well or get worse. Document Released: 02/19/2007 Document Revised: 01/01/2012 Document Reviewed: 04/22/2007 [facility] Patient Information 2014 [place], [place].   ________________________________________________________________________

## 2017-10-03 ENCOUNTER — Encounter (HOSPITAL_COMMUNITY): Payer: Self-pay

## 2017-10-03 ENCOUNTER — Ambulatory Visit: Payer: 59 | Admitting: Psychiatry

## 2017-10-03 ENCOUNTER — Other Ambulatory Visit: Payer: Self-pay

## 2017-10-03 ENCOUNTER — Encounter (HOSPITAL_COMMUNITY)
Admission: RE | Admit: 2017-10-03 | Discharge: 2017-10-03 | Disposition: A | Discharge status: Home / Self Care | Payer: 59 | Source: Ambulatory Visit | Attending: Surgery | Admitting: Surgery

## 2017-10-03 DIAGNOSIS — Z6841 Body Mass Index (BMI) 40.0 and over, adult: Secondary | ICD-10-CM | POA: Diagnosis not present

## 2017-10-03 DIAGNOSIS — Z01812 Encounter for preprocedural laboratory examination: Secondary | ICD-10-CM | POA: Diagnosis not present

## 2017-10-03 LAB — CBC WITH DIFFERENTIAL/PLATELET
BASOS ABS: 0 10*3/uL (ref 0.0–0.1)
Basophils Relative: 1 %
EOS PCT: 2 %
Eosinophils Absolute: 0.2 10*3/uL (ref 0.0–0.7)
HEMATOCRIT: 40.1 % (ref 36.0–46.0)
Hemoglobin: 13 g/dL (ref 12.0–15.0)
LYMPHS ABS: 2.9 10*3/uL (ref 0.7–4.0)
LYMPHS PCT: 42 %
MCH: 28.8 pg (ref 26.0–34.0)
MCHC: 32.4 g/dL (ref 30.0–36.0)
MCV: 88.9 fL (ref 78.0–100.0)
MONO ABS: 0.6 10*3/uL (ref 0.1–1.0)
MONOS PCT: 8 %
Neutro Abs: 3.3 10*3/uL (ref 1.7–7.7)
Neutrophils Relative %: 47 %
PLATELETS: 289 10*3/uL (ref 150–400)
RBC: 4.51 MIL/uL (ref 3.87–5.11)
RDW: 15.7 % — ABNORMAL HIGH (ref 11.5–15.5)
WBC: 7 10*3/uL (ref 4.0–10.5)

## 2017-10-03 LAB — COMPREHENSIVE METABOLIC PANEL
ALK PHOS: 79 U/L (ref 38–126)
ALT: 16 U/L (ref 14–54)
AST: 15 U/L (ref 15–41)
Albumin: 3.5 g/dL (ref 3.5–5.0)
Anion gap: 5 (ref 5–15)
BILIRUBIN TOTAL: 0.3 mg/dL (ref 0.3–1.2)
BUN: 11 mg/dL (ref 6–20)
CALCIUM: 9.1 mg/dL (ref 8.9–10.3)
CHLORIDE: 108 mmol/L (ref 101–111)
CO2: 26 mmol/L (ref 22–32)
CREATININE: 0.71 mg/dL (ref 0.44–1.00)
Glucose, Bld: 86 mg/dL (ref 65–99)
Potassium: 4.6 mmol/L (ref 3.5–5.1)
Sodium: 139 mmol/L (ref 135–145)
Total Protein: 7.6 g/dL (ref 6.5–8.1)

## 2017-10-03 LAB — ABO/RH: ABO/RH(D): A POS

## 2017-10-08 ENCOUNTER — Inpatient Hospital Stay (HOSPITAL_COMMUNITY): Payer: 59 | Admitting: Registered Nurse

## 2017-10-08 ENCOUNTER — Other Ambulatory Visit: Payer: Self-pay

## 2017-10-08 ENCOUNTER — Encounter (HOSPITAL_COMMUNITY)
Admission: RE | Disposition: A | Discharge status: Home / Self Care | Payer: Self-pay | Source: Ambulatory Visit | Attending: Surgery

## 2017-10-08 ENCOUNTER — Encounter (HOSPITAL_COMMUNITY): Payer: Self-pay | Admitting: *Deleted

## 2017-10-08 ENCOUNTER — Inpatient Hospital Stay (HOSPITAL_COMMUNITY)
Admission: RE | Admit: 2017-10-08 | Discharge: 2017-10-10 | DRG: 621 | Disposition: A | Discharge status: Home / Self Care | Payer: 59 | Source: Ambulatory Visit | Attending: Surgery | Admitting: Surgery

## 2017-10-08 DIAGNOSIS — Z6841 Body Mass Index (BMI) 40.0 and over, adult: Secondary | ICD-10-CM

## 2017-10-08 DIAGNOSIS — Z8249 Family history of ischemic heart disease and other diseases of the circulatory system: Secondary | ICD-10-CM | POA: Diagnosis not present

## 2017-10-08 DIAGNOSIS — Z9884 Bariatric surgery status: Secondary | ICD-10-CM

## 2017-10-08 DIAGNOSIS — Z7982 Long term (current) use of aspirin: Secondary | ICD-10-CM | POA: Diagnosis not present

## 2017-10-08 DIAGNOSIS — M419 Scoliosis, unspecified: Secondary | ICD-10-CM | POA: Diagnosis present

## 2017-10-08 DIAGNOSIS — Z79899 Other long term (current) drug therapy: Secondary | ICD-10-CM | POA: Diagnosis not present

## 2017-10-08 HISTORY — PX: LAPAROSCOPIC GASTRIC SLEEVE RESECTION: SHX5895

## 2017-10-08 LAB — PREGNANCY, URINE: PREG TEST UR: NEGATIVE

## 2017-10-08 LAB — CBC
HEMATOCRIT: 40.1 % (ref 36.0–46.0)
Hemoglobin: 13.3 g/dL (ref 12.0–15.0)
MCH: 29.3 pg (ref 26.0–34.0)
MCHC: 33.2 g/dL (ref 30.0–36.0)
MCV: 88.3 fL (ref 78.0–100.0)
Platelets: 292 10*3/uL (ref 150–400)
RBC: 4.54 MIL/uL (ref 3.87–5.11)
RDW: 15.4 % (ref 11.5–15.5)
WBC: 11.2 10*3/uL — AB (ref 4.0–10.5)

## 2017-10-08 LAB — CREATININE, SERUM
Creatinine, Ser: 0.79 mg/dL (ref 0.44–1.00)
GFR calc non Af Amer: >60 mL/min (ref >60)

## 2017-10-08 LAB — TYPE AND SCREEN
ABO/RH(D): A POS
Antibody Screen: NEGATIVE

## 2017-10-08 SURGERY — GASTRECTOMY, SLEEVE, LAPAROSCOPIC
Anesthesia: General | Site: Abdomen

## 2017-10-08 MED ORDER — SUGAMMADEX SODIUM 500 MG/5ML IV SOLN
INTRAVENOUS | Status: AC
  Filled 2017-10-08: qty 5

## 2017-10-08 MED ORDER — FENTANYL CITRATE (PF) 100 MCG/2ML IJ SOLN
25.0000 ug | INTRAMUSCULAR | Status: DC | PRN
Start: 2017-10-08 — End: 2017-10-08
  Administered 2017-10-08 (×3): 50 ug via INTRAVENOUS

## 2017-10-08 MED ORDER — SUCCINYLCHOLINE CHLORIDE 200 MG/10ML IV SOSY
PREFILLED_SYRINGE | INTRAVENOUS | Status: AC
  Filled 2017-10-08: qty 10

## 2017-10-08 MED ORDER — METOCLOPRAMIDE HCL 5 MG/ML IJ SOLN: 10.0000 mg | Freq: Once | INTRAMUSCULAR | Status: DC | PRN

## 2017-10-08 MED ORDER — 0.9 % SODIUM CHLORIDE (POUR BTL) OPTIME
TOPICAL | Status: DC | PRN
  Administered 2017-10-08: 1000 mL

## 2017-10-08 MED ORDER — LABETALOL HCL 5 MG/ML IV SOLN
INTRAVENOUS | Status: DC | PRN
  Administered 2017-10-08 (×4): 5 mg via INTRAVENOUS

## 2017-10-08 MED ORDER — DEXAMETHASONE SODIUM PHOSPHATE 10 MG/ML IJ SOLN
INTRAMUSCULAR | Status: AC
  Filled 2017-10-08: qty 1

## 2017-10-08 MED ORDER — LIDOCAINE 2% (20 MG/ML) 5 ML SYRINGE
INTRAMUSCULAR | Status: AC
  Filled 2017-10-08: qty 5

## 2017-10-08 MED ORDER — PROPOFOL 10 MG/ML IV BOLUS
INTRAVENOUS | Status: DC | PRN
  Administered 2017-10-08: 20 mg via INTRAVENOUS
  Administered 2017-10-08: 250 mg via INTRAVENOUS

## 2017-10-08 MED ORDER — LIDOCAINE 2% (20 MG/ML) 5 ML SYRINGE
INTRAMUSCULAR | Status: DC | PRN
  Administered 2017-10-08: 80 mg via INTRAVENOUS

## 2017-10-08 MED ORDER — LACTATED RINGERS IV SOLN: INTRAVENOUS | Status: DC

## 2017-10-08 MED ORDER — ROCURONIUM BROMIDE 10 MG/ML (PF) SYRINGE
PREFILLED_SYRINGE | INTRAVENOUS | Status: DC | PRN
  Administered 2017-10-08: 40 mg via INTRAVENOUS
  Administered 2017-10-08: 20 mg via INTRAVENOUS
  Administered 2017-10-08 (×2): 10 mg via INTRAVENOUS

## 2017-10-08 MED ORDER — APREPITANT 40 MG PO CAPS
40.0000 mg | ORAL_CAPSULE | ORAL | Status: AC
  Administered 2017-10-08: 40 mg via ORAL
  Filled 2017-10-08 (×2): qty 1

## 2017-10-08 MED ORDER — FENTANYL CITRATE (PF) 100 MCG/2ML IJ SOLN
INTRAMUSCULAR | Status: AC
  Filled 2017-10-08: qty 2

## 2017-10-08 MED ORDER — PREMIER PROTEIN SHAKE
2.0000 [oz_av] | ORAL | Status: DC
  Administered 2017-10-09 – 2017-10-10 (×7): 2 [oz_av] via ORAL

## 2017-10-08 MED ORDER — SCOPOLAMINE 1 MG/3DAYS TD PT72
MEDICATED_PATCH | TRANSDERMAL | Status: AC
  Filled 2017-10-08: qty 1

## 2017-10-08 MED ORDER — MIDAZOLAM HCL 5 MG/5ML IJ SOLN
INTRAMUSCULAR | Status: DC | PRN
  Administered 2017-10-08: 2 mg via INTRAVENOUS

## 2017-10-08 MED ORDER — BUPIVACAINE LIPOSOME 1.3 % IJ SUSP
20.0000 mL | Freq: Once | INTRAMUSCULAR | Status: AC
  Administered 2017-10-08: 20 mL
  Filled 2017-10-08: qty 20

## 2017-10-08 MED ORDER — LACTATED RINGERS IV SOLN
INTRAVENOUS | Status: DC | PRN
  Administered 2017-10-08: 13:00:00 via INTRAVENOUS

## 2017-10-08 MED ORDER — LABETALOL HCL 5 MG/ML IV SOLN
INTRAVENOUS | Status: AC
  Filled 2017-10-08: qty 4

## 2017-10-08 MED ORDER — PANTOPRAZOLE SODIUM 40 MG IV SOLR
40.0000 mg | Freq: Every day | INTRAVENOUS | Status: DC
  Administered 2017-10-08 – 2017-10-09 (×2): 40 mg via INTRAVENOUS
  Filled 2017-10-08 (×2): qty 40

## 2017-10-08 MED ORDER — DEXAMETHASONE SODIUM PHOSPHATE 10 MG/ML IJ SOLN
INTRAMUSCULAR | Status: DC | PRN
  Administered 2017-10-08: 10 mg via INTRAVENOUS

## 2017-10-08 MED ORDER — OXYCODONE HCL 5 MG/5ML PO SOLN
5.0000 mg | ORAL | Status: DC | PRN
  Administered 2017-10-09 – 2017-10-10 (×4): 5 mg via ORAL
  Filled 2017-10-08 (×4): qty 5

## 2017-10-08 MED ORDER — ACETAMINOPHEN 500 MG PO TABS
1000.0000 mg | ORAL_TABLET | ORAL | Status: AC
  Administered 2017-10-08: 1000 mg via ORAL
  Filled 2017-10-08: qty 2

## 2017-10-08 MED ORDER — GABAPENTIN 300 MG PO CAPS
300.0000 mg | ORAL_CAPSULE | ORAL | Status: AC
  Administered 2017-10-08: 300 mg via ORAL
  Filled 2017-10-08: qty 1

## 2017-10-08 MED ORDER — CELECOXIB 200 MG PO CAPS
400.0000 mg | ORAL_CAPSULE | ORAL | Status: AC
  Administered 2017-10-08: 400 mg via ORAL
  Filled 2017-10-08: qty 2

## 2017-10-08 MED ORDER — SUGAMMADEX SODIUM 500 MG/5ML IV SOLN
INTRAVENOUS | Status: DC | PRN
  Administered 2017-10-08: 325 mg via INTRAVENOUS

## 2017-10-08 MED ORDER — ACETAMINOPHEN 160 MG/5ML PO SOLN: 650.0000 mg | ORAL | Status: DC | PRN

## 2017-10-08 MED ORDER — SUCCINYLCHOLINE CHLORIDE 200 MG/10ML IV SOSY
PREFILLED_SYRINGE | INTRAVENOUS | Status: DC | PRN
  Administered 2017-10-08: 100 mg via INTRAVENOUS

## 2017-10-08 MED ORDER — LABETALOL HCL 5 MG/ML IV SOLN
5.0000 mg | INTRAVENOUS | Status: AC | PRN
  Administered 2017-10-08 (×3): 5 mg via INTRAVENOUS

## 2017-10-08 MED ORDER — LACTATED RINGERS IR SOLN
Status: DC | PRN
  Administered 2017-10-08: 1000 mL

## 2017-10-08 MED ORDER — HEPARIN SODIUM (PORCINE) 5000 UNIT/ML IJ SOLN
5000.0000 [IU] | INTRAMUSCULAR | Status: AC
  Administered 2017-10-08: 5000 [IU] via SUBCUTANEOUS
  Filled 2017-10-08: qty 1

## 2017-10-08 MED ORDER — MEPERIDINE HCL 50 MG/ML IJ SOLN: 6.2500 mg | INTRAMUSCULAR | Status: DC | PRN

## 2017-10-08 MED ORDER — LABETALOL HCL 5 MG/ML IV SOLN
20.0000 mg | INTRAVENOUS | Status: DC | PRN
  Administered 2017-10-08: 20 mg via INTRAVENOUS

## 2017-10-08 MED ORDER — MORPHINE SULFATE (PF) 2 MG/ML IV SOLN
1.0000 mg | INTRAVENOUS | Status: DC | PRN
  Administered 2017-10-08 (×3): 2 mg via INTRAVENOUS
  Filled 2017-10-08 (×3): qty 1

## 2017-10-08 MED ORDER — SODIUM CHLORIDE 0.9 % IJ SOLN
INTRAMUSCULAR | Status: AC
  Filled 2017-10-08: qty 10

## 2017-10-08 MED ORDER — SCOPOLAMINE 1 MG/3DAYS TD PT72
MEDICATED_PATCH | TRANSDERMAL | Status: DC | PRN
  Administered 2017-10-08: 1 via TRANSDERMAL

## 2017-10-08 MED ORDER — HEPARIN SODIUM (PORCINE) 5000 UNIT/ML IJ SOLN
5000.0000 [IU] | Freq: Three times a day (TID) | INTRAMUSCULAR | Status: DC
  Administered 2017-10-08 – 2017-10-10 (×5): 5000 [IU] via SUBCUTANEOUS
  Filled 2017-10-08 (×5): qty 1

## 2017-10-08 MED ORDER — KCL IN DEXTROSE-NACL 20-5-0.45 MEQ/L-%-% IV SOLN
INTRAVENOUS | Status: DC
  Administered 2017-10-08 – 2017-10-09 (×3): via INTRAVENOUS
  Administered 2017-10-10: 100 mL/h via INTRAVENOUS
  Filled 2017-10-08 (×6): qty 1000

## 2017-10-08 MED ORDER — MIDAZOLAM HCL 2 MG/2ML IJ SOLN
INTRAMUSCULAR | Status: AC
  Filled 2017-10-08: qty 2

## 2017-10-08 MED ORDER — ROCURONIUM BROMIDE 50 MG/5ML IV SOSY
PREFILLED_SYRINGE | INTRAVENOUS | Status: AC
  Filled 2017-10-08: qty 5

## 2017-10-08 MED ORDER — CEFOTETAN DISODIUM-DEXTROSE 2-2.08 GM-%(50ML) IV SOLR
2.0000 g | INTRAVENOUS | Status: AC
  Administered 2017-10-08: 2 g via INTRAVENOUS
  Filled 2017-10-08: qty 50

## 2017-10-08 MED ORDER — PROPOFOL 10 MG/ML IV BOLUS
INTRAVENOUS | Status: AC
  Filled 2017-10-08: qty 40

## 2017-10-08 MED ORDER — ONDANSETRON HCL 4 MG/2ML IJ SOLN
INTRAMUSCULAR | Status: DC | PRN
  Administered 2017-10-08: 4 mg via INTRAVENOUS

## 2017-10-08 MED ORDER — GABAPENTIN 250 MG/5ML PO SOLN
200.0000 mg | Freq: Two times a day (BID) | ORAL | Status: DC
  Administered 2017-10-08 – 2017-10-10 (×4): 200 mg via ORAL
  Filled 2017-10-08 (×4): qty 4

## 2017-10-08 MED ORDER — LABETALOL HCL 5 MG/ML IV SOLN
5.0000 mg | Freq: Once | INTRAVENOUS | Status: AC
  Administered 2017-10-08: 5 mg via INTRAVENOUS

## 2017-10-08 MED ORDER — CHLORHEXIDINE GLUCONATE CLOTH 2 % EX PADS: 6.0000 | MEDICATED_PAD | Freq: Once | CUTANEOUS | Status: DC

## 2017-10-08 MED ORDER — LIDOCAINE 2% (20 MG/ML) 5 ML SYRINGE
INTRAMUSCULAR | Status: DC | PRN
  Administered 2017-10-08: 1 mg/kg/h via INTRAVENOUS

## 2017-10-08 MED ORDER — SODIUM CHLORIDE 0.9 % IJ SOLN
INTRAMUSCULAR | Status: DC | PRN
  Administered 2017-10-08: 10 mL

## 2017-10-08 MED ORDER — HYDRALAZINE HCL 20 MG/ML IJ SOLN
10.0000 mg | INTRAMUSCULAR | Status: DC | PRN
  Filled 2017-10-08 (×2): qty 1

## 2017-10-08 MED ORDER — ONDANSETRON HCL 4 MG/2ML IJ SOLN
INTRAMUSCULAR | Status: AC
  Filled 2017-10-08: qty 2

## 2017-10-08 MED ORDER — FENTANYL CITRATE (PF) 100 MCG/2ML IJ SOLN
INTRAMUSCULAR | Status: DC | PRN
  Administered 2017-10-08 (×4): 50 ug via INTRAVENOUS

## 2017-10-08 MED ORDER — DEXAMETHASONE SODIUM PHOSPHATE 4 MG/ML IJ SOLN: 4.0000 mg | INTRAMUSCULAR | Status: DC

## 2017-10-08 MED ORDER — STERILE WATER FOR IRRIGATION IR SOLN
Status: DC | PRN
  Administered 2017-10-08: 1000 mL

## 2017-10-08 MED ORDER — ONDANSETRON HCL 4 MG/2ML IJ SOLN
4.0000 mg | INTRAMUSCULAR | Status: DC | PRN
  Administered 2017-10-08 – 2017-10-10 (×7): 4 mg via INTRAVENOUS
  Filled 2017-10-08 (×7): qty 2

## 2017-10-08 SURGICAL SUPPLY — 59 items
APPLICATOR COTTON TIP 6IN STRL (MISCELLANEOUS) IMPLANT
APPLIER CLIP 5 13 M/L LIGAMAX5 (MISCELLANEOUS) ×3
APPLIER CLIP ROT 10 11.4 M/L (STAPLE)
APPLIER CLIP ROT 13.4 12 LRG (CLIP)
BLADE SURG 15 STRL LF DISP TIS (BLADE) ×1 IMPLANT
BLADE SURG 15 STRL SS (BLADE) ×2
CABLE HIGH FREQUENCY MONO STRZ (ELECTRODE) ×3 IMPLANT
CLIP APPLIE 5 13 M/L LIGAMAX5 (MISCELLANEOUS) ×1 IMPLANT
CLIP APPLIE ROT 10 11.4 M/L (STAPLE) IMPLANT
CLIP APPLIE ROT 13.4 12 LRG (CLIP) IMPLANT
DERMABOND ADVANCED (GAUZE/BANDAGES/DRESSINGS) ×2
DERMABOND ADVANCED .7 DNX12 (GAUZE/BANDAGES/DRESSINGS) ×1 IMPLANT
DEVICE PMI PUNCTURE CLOSURE (MISCELLANEOUS) ×3 IMPLANT
DEVICE SUT QUICK LOAD TK 5 (STAPLE) IMPLANT
DEVICE SUT TI-KNOT TK 5X26 (MISCELLANEOUS) IMPLANT
DEVICE SUTURE ENDOST 10MM (ENDOMECHANICALS) IMPLANT
DEVICE TI KNOT TK5 (MISCELLANEOUS)
DISSECTOR BLUNT TIP ENDO 5MM (MISCELLANEOUS) IMPLANT
ELECT REM PT RETURN 15FT ADLT (MISCELLANEOUS) ×3 IMPLANT
GAUZE SPONGE 4X4 12PLY STRL (GAUZE/BANDAGES/DRESSINGS) IMPLANT
GLOVE BIOGEL M 8.0 STRL (GLOVE) ×3 IMPLANT
GOWN STRL REUS W/TWL XL LVL3 (GOWN DISPOSABLE) ×12 IMPLANT
HANDLE STAPLE EGIA 4 XL (STAPLE) ×3 IMPLANT
HOVERMATT SINGLE USE (MISCELLANEOUS) ×3 IMPLANT
KIT BASIN OR (CUSTOM PROCEDURE TRAY) ×3 IMPLANT
MARKER SKIN DUAL TIP RULER LAB (MISCELLANEOUS) ×3 IMPLANT
NEEDLE SPNL 22GX3.5 QUINCKE BK (NEEDLE) ×3 IMPLANT
PACK UNIVERSAL I (CUSTOM PROCEDURE TRAY) ×3 IMPLANT
QUICK LOAD TK 5 (STAPLE)
RELOAD TRI 45 ART MED THCK BLK (STAPLE) ×3 IMPLANT
RELOAD TRI 45 ART MED THCK PUR (STAPLE) ×3 IMPLANT
RELOAD TRI 60 ART MED THCK BLK (STAPLE) ×3 IMPLANT
RELOAD TRI 60 ART MED THCK PUR (STAPLE) ×6 IMPLANT
SCISSORS LAP 5X45 EPIX DISP (ENDOMECHANICALS) IMPLANT
SET IRRIG TUBING LAPAROSCOPIC (IRRIGATION / IRRIGATOR) ×3 IMPLANT
SHEARS HARMONIC ACE PLUS 45CM (MISCELLANEOUS) ×3 IMPLANT
SLEEVE ADV FIXATION 5X100MM (TROCAR) ×6 IMPLANT
SLEEVE GASTRECTOMY 36FR VISIGI (MISCELLANEOUS) ×3 IMPLANT
SOLUTION ANTI FOG 6CC (MISCELLANEOUS) ×3 IMPLANT
SPONGE LAP 18X18 X RAY DECT (DISPOSABLE) ×3 IMPLANT
STAPLER VISISTAT 35W (STAPLE) ×3 IMPLANT
SUT MNCRL AB 4-0 PS2 18 (SUTURE) ×3 IMPLANT
SUT SURGIDAC NAB ES-9 0 48 120 (SUTURE) IMPLANT
SUT VIC AB 4-0 SH 18 (SUTURE) ×3 IMPLANT
SUT VICRYL 0 TIES 12 18 (SUTURE) ×3 IMPLANT
SYR 10ML ECCENTRIC (SYRINGE) ×3 IMPLANT
SYR 20CC LL (SYRINGE) ×3 IMPLANT
SYR 50ML LL SCALE MARK (SYRINGE) ×3 IMPLANT
TOWEL OR 17X26 10 PK STRL BLUE (TOWEL DISPOSABLE) ×6 IMPLANT
TOWEL OR NON WOVEN STRL DISP B (DISPOSABLE) ×3 IMPLANT
TRAY FOLEY W/METER SILVER 16FR (SET/KITS/TRAYS/PACK) IMPLANT
TROCAR ADV FIXATION 5X100MM (TROCAR) ×3 IMPLANT
TROCAR BLADELESS 15MM (ENDOMECHANICALS) ×3 IMPLANT
TROCAR BLADELESS OPT 5 100 (ENDOMECHANICALS) ×3 IMPLANT
TUBE CALIBRATION LAPBAND (TUBING) ×3 IMPLANT
TUBING CONNECTING 10 (TUBING) ×4 IMPLANT
TUBING CONNECTING 10' (TUBING) ×2
TUBING ENDO SMARTCAP (MISCELLANEOUS) ×3 IMPLANT
TUBING INSUF HEATED (TUBING) ×3 IMPLANT

## 2017-10-08 NOTE — Progress Notes (Signed)
Discussed post op day goals with patient including ambulation, IS, diet progression, pain, and nausea control.  Questions answered. 

## 2017-10-08 NOTE — Anesthesia Procedure Notes (Signed)
Procedure Name: Intubation Date/Time: 10/08/2017 1:21 PM Performed by: Victoriano Lain, CRNA Pre-anesthesia Checklist: Patient identified, Emergency Drugs available, Suction available, Patient being monitored and Timeout performed Patient Re-evaluated:Patient Re-evaluated prior to induction Oxygen Delivery Method: Circle system utilized Preoxygenation: Pre-oxygenation with 100% oxygen Induction Type: IV induction Ventilation: Mask ventilation without difficulty Laryngoscope Size: Mac and 4 Grade View: Grade I Tube type: Oral Tube size: 7.5 mm Number of attempts: 1 Airway Equipment and Method: Stylet Placement Confirmation: ETT inserted through vocal cords under direct vision,  positive ETCO2 and breath sounds checked- equal and bilateral Secured at: 22 cm Tube secured with: Tape Dental Injury: Teeth and Oropharynx as per pre-operative assessment

## 2017-10-08 NOTE — Discharge Instructions (Signed)
° ° ° °GASTRIC BYPASS/SLEEVE ° Home Care Instructions ° ° These instructions are to help you care for yourself when you go home. ° °Call: If you have any problems. °• Call [phone] and ask for the surgeon on call °• If you need immediate help, come to the ER at [facility].  °• Tell the ER staff that you are a new post-op gastric bypass or gastric sleeve patient °  °Signs and symptoms to report: • Severe vomiting or nausea °o If you cannot keep down clear liquids for longer than 1 day, call your surgeon  °• Abdominal pain that does not get better after taking your pain medication °• Fever over 100.4° F with chills °• Heart beating over 100 beats a minute °• Shortness of breath at rest °• Chest pain °•  Redness, swelling, drainage, or foul odor at incision (surgical) sites °•  If your incisions open or pull apart °• Swelling or pain in calf (lower leg) °• Diarrhea (Loose bowel movements that happen often), frequent watery, uncontrolled bowel movements °• Constipation, (no bowel movements for 3 days) if this happens: Pick one °o Milk of Magnesia, 2 tablespoons by mouth, 3 times a day for 2 days if needed °o Stop taking Milk of Magnesia once you have a bowel movement °o Call your doctor if constipation continues °Or °o Miralax  (instead of Milk of Magnesia) following the label instructions °o Stop taking Miralax once you have a bowel movement °o Call your doctor if constipation continues °• Anything you think is not normal °  °Normal side effects after surgery: • Unable to sleep at night or unable to focus °• Irritability or moody °• Being tearful (crying) or depressed °These are common complaints, possibly related to your anesthesia medications that put you to sleep, stress of surgery, and change in lifestyle.  This usually goes away a few weeks after surgery.  If these feelings continue, call your primary care doctor. °  °Wound Care: You may have surgical glue, steri-strips, or staples over your incisions after  surgery °• Surgical glue:  Looks like a clear film over your incisions and will wear off a little at a time °• Steri-strips: Strips of tape over your incisions. You may notice a yellowish color on the skin under the steri-strips. This is used to make the   steri-strips stick better. Do not pull the steri-strips off - let them fall off °• Staples: Staples may be removed before you leave the hospital °o If you go home with staples, call [facility], [phone] at for an appointment with your surgeon’s nurse to have staples removed 10 days after surgery. °• Showering: You may shower two (2) days after your surgery unless your surgeon tells you differently °o Wash gently around incisions with warm soapy water, rinse well, and gently pat dry  °o No tub baths until staples are removed, steri-strips fall off or glue is gone.  °  °Medications: • Medications should be liquid or crushed if larger than the size of a dime °• Extended release pills (medication that release a little bit at a time through the day) should NOT be crushed or cut. (examples include XL, ER, DR, SR) °• Depending on the size and number of medications you take, you may need to space (take a few throughout the day)/change the time you take your medications so that you do not over-fill your pouch (smaller stomach) °• Make sure you follow-up with your primary care doctor to   make medication changes needed during rapid weight loss and life-style changes °• If you have diabetes, follow up with the doctor that orders your diabetes medication(s) within one week after surgery and check your blood sugar regularly. °• Do not drive while taking prescription pain medication  °• It is ok to take Tylenol by the bottle instructions with your pain medicine or instead of your pain medicine as needed.  DO NOT TAKE NSAIDS (EXAMPLES OF NSAIDS:  IBUPROFREN/ NAPROXEN)  °Diet:                    First 2 Weeks ° You will see the dietician t about two (2) weeks  after your surgery. The dietician will increase the types of foods you can eat if you are handling liquids well: °• If you have severe vomiting or nausea and cannot keep down clear liquids lasting longer than 1 day, call your surgeon @ ([phone]) °Protein Shake °• Drink at least 2 ounces of shake 5-6 times per day °• Each serving of protein shakes (usually 8 - 12 ounces) should have: °o 15 grams of protein  °o And no more than 5 grams of carbohydrate  °• Goal for protein each day: °o Men = 80 grams per day °o Women = 60 grams per day °• Protein powder may be added to fluids such as non-fat milk or Lactaid milk or unsweetened Soy/Almond milk (limit to 35 grams added protein powder per serving) ° °Hydration °• Slowly increase the amount of water and other clear liquids as tolerated (See Acceptable Fluids) °• Slowly increase the amount of protein shake as tolerated  °•  Sip fluids slowly and throughout the day.  Do not use straws. °• May use sugar substitutes in small amounts (no more than 6 - 8 packets per day; i.e. Splenda) ° °Fluid Goal °• The first goal is to drink at least 8 ounces of protein shake/drink per day (or as directed by the nutritionist); some examples of protein shakes are Syntrax Nectar, Adkins Advantage, EAS Edge HP, and Unjury. See handout from pre-op Bariatric Education Class: °o Slowly increase the amount of protein shake you drink as tolerated °o You may find it easier to slowly sip shakes throughout the day °o It is important to get your proteins in first °• Your fluid goal is to drink 64 - 100 ounces of fluid daily °o It may take a few weeks to build up to this °• 32 oz (or more) should be clear liquids  °And  °• 32 oz (or more) should be full liquids (see below for examples) °• Liquids should not contain sugar, caffeine, or carbonation ° °Clear Liquids: °• Water or Sugar-free flavored water (i.e. Fruit H2O, Propel) °• Decaffeinated coffee or tea (sugar-free) °• Crystal Lite, Wyler’s Lite,  Minute Maid Lite °• Sugar-free Jell-O °• Bouillon or broth °• Sugar-free Popsicle:   *Less than 20 calories each; Limit 1 per day ° °Full Liquids: °Protein Shakes/Drinks + 2 choices per day of other full liquids °• Full liquids must be: °o No More Than 15 grams of Carbs per serving  °o No More Than 3 grams of Fat per serving °• Strained low-fat cream soup (except Cream of Potato or Tomato) °• Non-Fat milk °• Fat-free Lactaid Milk °• Unsweetened Soy Or Unsweetened Almond Milk °• Low Sugar yogurt (Dannon Lite & Fit, Greek yogurt; Oikos Triple Zero; Chobani Simply 100; Yoplait 100 calorie Greek - No Fruit on the Bottom) ° °  °Vitamins   and Minerals • Start 1 day after surgery unless otherwise directed by your surgeon °• 2 Chewable Bariatric Specific Multivitamin / Multimineral Supplement with iron (Example: Bariatric Advantage Multi EA) °• Chewable Calcium with Vitamin D-3 °(Example: 3 Chewable Calcium Plus 600 with Vitamin D-3) °o Take 500 mg three (3) times a day for a total of 1500 mg each day °o Do not take all 3 doses of calcium at one time as it may cause constipation, and you can only absorb 500 mg  at a time  °o Do not mix multivitamins containing iron with calcium supplements; take 2 hours apart °• Menstruating women and those with a history of anemia (a blood disease that causes weakness) may need extra iron °o Talk with your doctor to see if you need more iron °• Do not stop taking or change any vitamins or minerals until you talk to your dietitian or surgeon °• Your Dietitian and/or surgeon must approve all vitamin and mineral supplements °  °Activity and Exercise: Limit your physical activity as instructed by your doctor.  It is important to continue walking at home.  During this time, use these guidelines: °• Do not lift anything greater than ten (10) pounds for at least two (2) weeks °• Do not go back to work or drive until your surgeon says you can °• You may have sex when you feel comfortable  °o It is  VERY important for female patients to use a reliable birth control method; fertility often increases after surgery  °o All hormonal birth control will be ineffective for 30 days after surgery due to medications given during surgery a barrier method must be used. °o Do not get pregnant for at least 18 months °• Start exercising as soon as your doctor tells you that you can °o Make sure your doctor approves any physical activity °• Start with a simple walking program °• Walk 5-15 minutes each day, 7 days per week.  °• Slowly increase until you are walking 30-45 minutes per day °Consider joining our BEL[employer] program. [phone] or email [email or web address] °  °Special Instructions Things to remember: °• Use your CPAP when sleeping if this applies to you ° °• [facility] has two free Bariatric Surgery Support Groups that meet monthly °o The 3rd Thursday of each month, 6 pm, [facility]  °o The 2nd Friday of each month, 11:45 am in the private dining room in the basement of  °• It is very important to keep all follow up appointments with your surgeon, dietitian, primary care physician, and behavioral health practitioner °• Routine follow up schedule with your surgeon include appointments at 2-3 weeks, 6-8 weeks, 6 months, and 1 year at a minimum.  Your surgeon may request to see you more often.   °o After the first year, please follow up with your bariatric surgeon and dietitian at least once a year in order to maintain best weight loss results °[facility]: [phone] °[facility]: [phone] °Bariatric Nurse Coordinator: [phone] °  °   Reviewed and Endorsed  °by [facility] Patient Education Committee, June, 2016 °Edits Approved: Aug, 2018 ° ° ° °

## 2017-10-08 NOTE — Transfer of Care (Signed)
Immediate Anesthesia Transfer of Care Note  Patient: Cathy Yang  Procedure(s) Performed: LAPAROSCOPIC GASTRIC SLEEVE RESECTION WITH UPPER ENDOSCOPY (N/A Abdomen)  Patient Location: PACU  Anesthesia Type:General  Level of Consciousness: awake, alert  and oriented  Airway & Oxygen Therapy: Patient Spontanous Breathing and Patient connected to face mask oxygen  Post-op Assessment: Report given to RN and Post -op Vital signs reviewed and stable  Post vital signs: Reviewed and stable  Last Vitals:  Vitals:   10/08/17 1100 10/08/17 1120  BP: (!) 161/127 (!) 165/69  Pulse: 98   Resp: 18   Temp: 37.4 C   SpO2: 100%     Last Pain:  Vitals:   10/08/17 1100  TempSrc: Oral         Complications: No apparent anesthesia complications

## 2017-10-08 NOTE — Interval H&P Note (Signed)
History and Physical Interval Note:  10/08/2017 12:27 PM  Cathy Yang  has presented today for surgery, with the diagnosis of MORBID OBESITY  The various methods of treatment have been discussed with the patient and family. After consideration of risks, benefits and other options for treatment, the patient has consented to  Procedure(s): LAPAROSCOPIC GASTRIC SLEEVE RESECTION WITH UPPER ENDO (N/A) as a surgical intervention .  The patient's history has been reviewed, patient examined, no change in status, stable for surgery.  I have reviewed the patient's chart and labs.  Questions were answered to the patient's satisfaction.     Valarie MerinoMatthew B Aanya Haynes

## 2017-10-08 NOTE — Anesthesia Preprocedure Evaluation (Signed)
Anesthesia Evaluation  Patient identified by MRN, date of birth, ID band Patient awake    Reviewed: Allergy & Precautions, NPO status , Patient's Chart, lab work & pertinent test results  Airway Mallampati: I  TM Distance: >3 FB Neck ROM: Full    Dental no notable dental hx.    Pulmonary neg pulmonary ROS,    Pulmonary exam normal breath sounds clear to auscultation       Cardiovascular hypertension, negative cardio ROS Normal cardiovascular exam Rhythm:Regular Rate:Normal     Neuro/Psych negative neurological ROS  negative psych ROS   GI/Hepatic negative GI ROS, Neg liver ROS,   Endo/Other  Morbid obesity  Renal/GU negative Renal ROS  negative genitourinary   Musculoskeletal negative musculoskeletal ROS (+)   Abdominal   Peds negative pediatric ROS (+)  Hematology negative hematology ROS (+)   Anesthesia Other Findings   Reproductive/Obstetrics negative OB ROS                             Anesthesia Physical Anesthesia Plan  ASA: III  Anesthesia Plan: General   Post-op Pain Management:    Induction: Intravenous  PONV Risk Score and Plan: 4 or greater and Treatment may vary due to age or medical condition, Dexamethasone, Ondansetron, Midazolam and Scopolamine patch - Pre-op  Airway Management Planned: Oral ETT  Additional Equipment:   Intra-op Plan:   Post-operative Plan: Extubation in OR  Informed Consent: I have reviewed the patients History and Physical, chart, labs and discussed the procedure including the risks, benefits and alternatives for the proposed anesthesia with the patient or authorized representative who has indicated his/her understanding and acceptance.   Dental advisory given  Plan Discussed with: CRNA  Anesthesia Plan Comments:         Anesthesia Quick Evaluation

## 2017-10-08 NOTE — Anesthesia Postprocedure Evaluation (Signed)
Anesthesia Post Note  Patient: Cathy Yang  Procedure(s) Performed: LAPAROSCOPIC GASTRIC SLEEVE RESECTION WITH UPPER ENDOSCOPY (N/A Abdomen)     Patient location during evaluation: PACU Anesthesia Type: General Level of consciousness: awake and alert Pain management: pain level controlled Vital Signs Assessment: post-procedure vital signs reviewed and stable Respiratory status: spontaneous breathing, nonlabored ventilation, respiratory function stable and patient connected to nasal cannula oxygen Cardiovascular status: blood pressure returned to baseline and stable Postop Assessment: no apparent nausea or vomiting Anesthetic complications: no    Last Vitals:  Vitals:   10/08/17 1700 10/08/17 1715  BP: (!) 154/109 (!) 163/82  Pulse: 92 96  Resp: (!) 30 (!) 30  Temp:  37.2 C  SpO2: 100% 100%    Last Pain:  Vitals:   10/08/17 1715  TempSrc:   PainSc: Asleep                 Phillips Groutarignan, Izyan Ezzell

## 2017-10-08 NOTE — Op Note (Signed)
Cathy Yang [id number] 11/01/1985 10/08/2017  Preoperative diagnosis: morbid obesity  Postoperative diagnosis: Same   Procedure: upper endoscopy   Surgeon: Mary SellaEric M. Karagan Lehr M.D., FACS   Anesthesia: Gen.   Indications for procedure: 31 y.o. year old female undergoing Laparoscopic Gastric Sleeve Resection and an EGD was requested to evaluate the new gastric sleeve.   Description of procedure: After we have completed the sleeve resection, I scrubbed out and obtained the Olympus endoscope. I gently placed endoscope in the patient's oropharynx and gently glided it down the esophagus without any difficulty under direct visualization. Once I was in the gastric sleeve, I insufflated the stomach with air. I was able to cannulate and advanced the scope through the gastric sleeve. I was able to cannulate the duodenum with ease. Dr. Daphine DeutscherMartin had placed saline in the upper abdomen. Upon further insufflation of the gastric sleeve there was no evidence of bubbles. GE junction located at 42 cm.  Upon further inspection of the gastric sleeve, the mucosa appeared normal. There is no evidence of any mucosal abnormality. The sleeve was widely patent at the angularis. There was no evidence of bleeding. The gastric sleeve was decompressed. The scope was withdrawn. The patient tolerated this portion of the procedure well. Please see Dr Ermalene SearingMartin's operative note for details regarding the laparoscopic gastric sleeve resection.   Mary SellaEric M. Andrey CampanileWilson, MD, FACS  General, Bariatric, & Minimally Invasive Surgery  [facility], [place]

## 2017-10-08 NOTE — Op Note (Signed)
Surgeon: Wenda LowMatt Antoino Westhoff, MD, FACS  Asst:  Gaynelle AduEric Wilson, MD, FACS  Anes:  General endotracheal  Procedure: Laparoscopic sleeve gastrectomy and upper endoscopy  Diagnosis: Morbid obesity BMI > 50  Complications: none EBL:  25 cc  Description of Procedure:  The patient was take to OR 2 and given general anesthesia.  The abdomen was prepped with PCMX and draped sterilely.  A timeout was performed.  Access to the abdomen was achieved with a 5 mm Optiview through the left upper quadrant.  Following insufflation, the state of the abdomen was found to be free of adhesions.  The balloon catheter was inserted and the balloon test with 10 cc of air was negative.  The ViSiGi 36Fr tube was inserted to deflate the stomach and was pulled back into the esophagus.    The pylorus was identified and we measured 5 cm back and marked the antrum.  At that point we began dissection to take down the greater curvature of the stomach using the Harmonic scalpel.  This dissection was taken all the way up to the left crus.  The stomach was tightly attached to the spleen.    Posterior attachments of the stomach were also taken down.    The ViSiGi tube was then passed into the antrum and suction applied so that it was snug along the lessor curvature.  The "crow's foot" or incisura was identified.  The sleeve gastrectomy was begun using the [employer]ovidien platform stapler beginning with a 4.5 cm black load with TRS;  This was followed by a 6 cm black load with TRS.  The sleeve was completed with 6 cm purple loads with TRS.  When the sleeve was complete the tube was taken off suction and insufflated briefly.  The tube was withdrawn.  Upper endoscopy was then performed by Dr. Andrey CampanileWilson and it looked good.     The specimen was extracted through the 15 trocar site which was then closed with a 0 vicryl.  Wounds were infiltrated with Exparel and closed with 4-0 monocryl.  Prior to ending the procedure I reinspected the staple line and found  bleeding up there the third from the last firing.  Three 5 mm clips were applied and controlled the bleeding.  Susy Frizzle.    Matt B. Daphine DeutscherMartin, MD, [facility], [place] [phone]

## 2017-10-09 ENCOUNTER — Encounter (HOSPITAL_COMMUNITY): Payer: Self-pay | Admitting: Surgery

## 2017-10-09 LAB — CBC WITH DIFFERENTIAL/PLATELET
BASOS ABS: 0 10*3/uL (ref 0.0–0.1)
BASOS PCT: 0 %
EOS ABS: 0 10*3/uL (ref 0.0–0.7)
Eosinophils Relative: 0 %
HCT: 40.2 % (ref 36.0–46.0)
HEMOGLOBIN: 13.2 g/dL (ref 12.0–15.0)
Lymphocytes Relative: 13 %
Lymphs Abs: 1.4 10*3/uL (ref 0.7–4.0)
MCH: 28.9 pg (ref 26.0–34.0)
MCHC: 32.8 g/dL (ref 30.0–36.0)
MCV: 88.2 fL (ref 78.0–100.0)
Monocytes Absolute: 0.6 10*3/uL (ref 0.1–1.0)
Monocytes Relative: 5 %
NEUTROS PCT: 82 %
Neutro Abs: 9.1 10*3/uL — ABNORMAL HIGH (ref 1.7–7.7)
Platelets: 321 10*3/uL (ref 150–400)
RBC: 4.56 MIL/uL (ref 3.87–5.11)
RDW: 15.3 % (ref 11.5–15.5)
WBC: 11 10*3/uL — AB (ref 4.0–10.5)

## 2017-10-09 NOTE — Progress Notes (Signed)
Patient slow to tolerate bari clears due to nausea.  Nausea controlled at this time.  Will look to increase to protein when tolerating 12 ounces of clear fluids.

## 2017-10-09 NOTE — Progress Notes (Signed)
Patient alert and oriented, Post op day 1.  Provided support and encouragement.  Encouraged pulmonary toilet, ambulation and small sips of liquids.  All questions answered.  Will continue to monitor. 

## 2017-10-09 NOTE — Plan of Care (Signed)
Nutrition Education Note  Received consult for diet education per DROP protocol.   Discussed 2 week post op diet with pt. Emphasized that liquids must be non carbonated, non caffeinated, and sugar free. Fluid goals discussed. Pt to follow up with outpatient bariatric RD for further diet progression after 2 weeks. Multivitamins and minerals also reviewed. Teach back method used, pt expressed understanding, expect good compliance.   Diet: First 2 Weeks  You will see the nutritionist about two (2) weeks after your surgery. The nutritionist will increase the types of foods you can eat if you are handling liquids well:  If you have severe vomiting or nausea and cannot handle clear liquids lasting longer than 1 day, call your surgeon  Protein Shake  Drink at least 2 ounces of shake 5-6 times per day  Each serving of protein shakes (usually 8 - 12 ounces) should have a minimum of:  15 grams of protein  And no more than 5 grams of carbohydrate  Goal for protein each day:  Men = 80 grams per day  Women = 60 grams per day  Protein powder may be added to fluids such as non-fat milk or Lactaid milk or Soy milk (limit to 35 grams added protein powder per serving)   Hydration  Slowly increase the amount of water and other clear liquids as tolerated (See Acceptable Fluids)  Slowly increase the amount of protein shake as tolerated  Sip fluids slowly and throughout the day  May use sugar substitutes in small amounts (no more than 6 - 8 packets per day; i.e. Splenda)   Fluid Goal  The first goal is to drink at least 8 ounces of protein shake/drink per day (or as directed by the nutritionist); some examples of protein shakes are Premier Protein, Syntrax Nectar, Adkins Advantage, EAS Edge HP, and Unjury. See handout from pre-op Bariatric Education Class:  Slowly increase the amount of protein shake you drink as tolerated  You may find it easier to slowly sip shakes throughout the day  It is important to  get your proteins in first  Your fluid goal is to drink 64 - 100 ounces of fluid daily  It may take a few weeks to build up to this  32 oz (or more) should be clear liquids  And  32 oz (or more) should be full liquids (see below for examples)  Liquids should not contain sugar, caffeine, or carbonation   Clear Liquids:  Water or Sugar-free flavored water (i.e. Fruit H2O, Propel)  Decaffeinated coffee or tea (sugar-free)  Crystal Lite, Wyler?s Lite, Minute Maid Lite  Sugar-free Jell-O  Bouillon or broth  Sugar-free Popsicle: *Less than 20 calories each; Limit 1 per day   Full Liquids:  Protein Shakes/Drinks + 2 choices per day of other full liquids  Full liquids must be:  No More Than 12 grams of Carbs per serving  No More Than 3 grams of Fat per serving  Strained low-fat cream soup  Non-Fat milk  Fat-free Lactaid Milk  Sugar-free yogurt (Dannon Lite & Fit, Greek yogurt, Oikos Zero)   Cathy Santosuosso, MS, RD, LDN [facility] Clinical Dietitian Pager: [phone] After Hours Pager: [phone]   

## 2017-10-09 NOTE — Progress Notes (Signed)
Patient tolerating ice chips only.  Introduced to chicken broth and crystal light for water.  Will revisit progress.

## 2017-10-10 LAB — CBC WITH DIFFERENTIAL/PLATELET
BASOS ABS: 0 10*3/uL (ref 0.0–0.1)
Basophils Relative: 0 %
EOS PCT: 0 %
Eosinophils Absolute: 0 10*3/uL (ref 0.0–0.7)
HEMATOCRIT: 40.3 % (ref 36.0–46.0)
Hemoglobin: 13 g/dL (ref 12.0–15.0)
LYMPHS ABS: 3.1 10*3/uL (ref 0.7–4.0)
LYMPHS PCT: 35 %
MCH: 28.8 pg (ref 26.0–34.0)
MCHC: 32.3 g/dL (ref 30.0–36.0)
MCV: 89.2 fL (ref 78.0–100.0)
MONO ABS: 0.7 10*3/uL (ref 0.1–1.0)
MONOS PCT: 8 %
NEUTROS ABS: 5.1 10*3/uL (ref 1.7–7.7)
Neutrophils Relative %: 57 %
PLATELETS: 312 10*3/uL (ref 150–400)
RBC: 4.52 MIL/uL (ref 3.87–5.11)
RDW: 15.6 % — AB (ref 11.5–15.5)
WBC: 8.9 10*3/uL (ref 4.0–10.5)

## 2017-10-10 NOTE — Progress Notes (Signed)
Patient alert and oriented, pain is controlled. Patient is tolerating fluids, advanced to protein shake today, patient is tolerating well.  Reviewed Gastric sleeve discharge instructions with patient and patient is able to articulate understanding.  Provided information on BELT program, Support Group and [facility] outpatient pharmacy. All questions answered, will continue to monitor.  

## 2017-10-10 NOTE — Progress Notes (Signed)
Discharge instructions given. Pt verbalized understanding and all questions were answered.  

## 2017-10-10 NOTE — Discharge Summary (Signed)
  Physician Discharge Summary  Patient ID: Cathy Yang MRN: [id number] DOB/AGE: 30/06/1986 30 y.o.  Admit date: 10/08/2017 Discharge date: 10/10/2017  Admission Diagnoses:  Morbid obesity  Discharge Diagnoses:  same  Active Problems:   S/P laparoscopic sleeve gastrectomy   Surgery:  Sleeve gastrectomy  Discharged Condition: improved  Hospital Course:   Had surgery on Monday.  Slowly progressed on liquids on PD 1 but was ready for discharge drinking well on PD 2.    Consults: none  Significant Diagnostic Studies: none    Discharge Exam: Blood pressure (!) 157/99, pulse 86, temperature 98.9 F (37.2 C), temperature source Oral, resp. rate 18, height 5\' 5"  (1.651 m), weight (!) 153.9 kg (339 lb 4.6 oz), SpO2 96 %. Incisions OK  Disposition: 01-Home or Self Care  Discharge Instructions    Ambulate hourly while awake   Complete by:  As directed    Call MD for:  difficulty breathing, headache or visual disturbances   Complete by:  As directed    Call MD for:  persistant dizziness or light-headedness   Complete by:  As directed    Call MD for:  persistant nausea and vomiting   Complete by:  As directed    Call MD for:  redness, tenderness, or signs of infection (pain, swelling, redness, odor or green/yellow discharge around incision site)   Complete by:  As directed    Call MD for:  severe uncontrolled pain   Complete by:  As directed    Call MD for:  temperature >101 F   Complete by:  As directed    Diet bariatric full liquid   Complete by:  As directed    Incentive spirometry   Complete by:  As directed    Perform hourly while awake     Allergies as of 10/10/2017   No Known Allergies     Medication List    STOP taking these medications   ibuprofen 200 MG tablet Commonly known as:  ADVIL,MOTRIN     TAKE these medications   acetaminophen 500 MG tablet Commonly known as:  TYLENOL Take 1,000 mg by mouth every 6 (six) hours as needed for moderate pain or  headache.   benzonatate 100 MG capsule Commonly known as:  TESSALON Take 1 capsule (100 mg total) by mouth every 8 (eight) hours.   dicyclomine 20 MG tablet Commonly known as:  BENTYL Take 1 tablet (20 mg total) by mouth 2 (two) times daily.   ergocalciferol 50000 units capsule Commonly known as:  VITAMIN D2 Take 50,000 Units by mouth every Monday.   ondansetron 4 MG tablet Commonly known as:  ZOFRAN Take 1 tablet (4 mg total) by mouth every 6 (six) hours.   sucralfate 1 GM/10ML suspension Commonly known as:  CARAFATE Take 10 mLs (1 g total) by mouth 4 (four) times daily -  with meals and at bedtime.      Follow-up Information    [facility]/2017.   Specialty:  General Surgery Why:  at 1045 with Dr Wenda LowMatt Maryalyce Sanjuan Contact information: [address] [place] [address] [phone]        [facility] Follow up.   Specialty:  General Surgery Contact information: [address] [place] [address] [phone]           Signed: Valarie MerinoMatthew B Vivan Vanderveer 10/10/2017, 10:24 AM

## 2017-10-10 NOTE — Progress Notes (Signed)
Patient alert and oriented, Post op day 2.  Provided support and encouragement.  Encouraged pulmonary toilet, ambulation and small sips of liquids.  All questions answered.  Will continue to monitor. 

## 2017-10-11 ENCOUNTER — Telehealth (HOSPITAL_COMMUNITY): Payer: Self-pay

## 2017-10-11 NOTE — Telephone Encounter (Signed)
Follow up with bariatric surgical patient to discuss post discharge questions.    1.  Are you having any pain not relieved by pain medication?no pain meds  2.  How much fluid total fluid intake have you had in the last 24/48 hours?  44 ounces includes ice, broth, shake  3.  How much protein intake have you had in the last 24/48 hours?45 grams  4.  Have you had any trouble making urine?no making urine lemonade color  5.  Have you had nausea that has not been relieved by nausea medication?has not needed nausea medication  6.  Are you ambulating every hour?yes  7.  Are you passing gas or had a BM?passing gas no BM took Miralax  8.  Do you know how to contact [facility]? [facility]? [facility]?yes  9.  Are you taking your vitamins and calcium without difficulty?no problems  10. Tell me how your incision looks?  Any redness, open incision, or drainage?shower today no problems with incision

## 2017-10-24 ENCOUNTER — Encounter: Payer: 59 | Attending: Surgery | Admitting: Registered"

## 2017-10-24 DIAGNOSIS — Z79899 Other long term (current) drug therapy: Secondary | ICD-10-CM | POA: Diagnosis not present

## 2017-10-24 DIAGNOSIS — E7800 Pure hypercholesterolemia, unspecified: Secondary | ICD-10-CM | POA: Insufficient documentation

## 2017-10-24 DIAGNOSIS — Z713 Dietary counseling and surveillance: Secondary | ICD-10-CM | POA: Diagnosis not present

## 2017-10-24 DIAGNOSIS — Z6841 Body Mass Index (BMI) 40.0 and over, adult: Secondary | ICD-10-CM | POA: Diagnosis not present

## 2017-10-24 DIAGNOSIS — R030 Elevated blood-pressure reading, without diagnosis of hypertension: Secondary | ICD-10-CM | POA: Insufficient documentation

## 2017-10-24 DIAGNOSIS — E669 Obesity, unspecified: Secondary | ICD-10-CM

## 2017-10-26 NOTE — Progress Notes (Signed)
Bariatric Class:  Appt start time: 1530 end time:  1630.  2 Week Post-Operative Nutrition Class  Patient was seen on 10/24/2017 for Post[facility]ative Nutrition [facility].   Surgery date: 10/08/2017 Surgery type: Sleeve [facility] weight at [facility]: 338.4 Weight today: 319.4 Weight change: 19 lbs loss  Pt states she has had some headaches, skin peeling, nausea, vomiting, and constipation. Pt states she only has abdominal pain where incisions are located.   TANITA  BODY COMP RESULTS  10/24/2017   BMI (kg/m^2) 53.2   Fat Mass (lbs) 180.8   Fat Free Mass (lbs) 138.6   Total Body Water (lbs) 104.8   The following the learning objectives were met by the patient during this course:  Identifies Phase 3A (Soft, High Proteins) Dietary Goals and will begin from 2 weeks post-operatively to 2 months post-operatively  Identifies appropriate sources of fluids and proteins   States protein recommendations and appropriate sources post-operatively  Identifies the need for appropriate texture modifications, mastication, and bite sizes when consuming solids  Identifies appropriate multivitamin and calcium sources post-operatively  Describes the need for physical activity post-operatively and will follow MD recommendations  States when to call healthcare provider regarding medication questions or post-operative complications  Handouts given during class include:  Phase 3A: Soft, High Protein Diet Handout  Follow-Up Plan: Patient will follow-[facility] at [facility] in 6 weeks for 2 month post-op nutrition visit for diet advancement per MD.

## 2017-11-26 ENCOUNTER — Ambulatory Visit (INDEPENDENT_AMBULATORY_CARE_PROVIDER_SITE_OTHER): Payer: Worker's Compensation

## 2017-11-26 ENCOUNTER — Encounter (HOSPITAL_COMMUNITY): Payer: Self-pay | Admitting: Emergency Medicine

## 2017-11-26 ENCOUNTER — Ambulatory Visit (HOSPITAL_COMMUNITY)
Admission: EM | Admit: 2017-11-26 | Discharge: 2017-11-26 | Disposition: A | Discharge status: Home / Self Care | Payer: Worker's Compensation | Attending: Family Medicine | Admitting: Family Medicine

## 2017-11-26 ENCOUNTER — Other Ambulatory Visit: Payer: Self-pay

## 2017-11-26 DIAGNOSIS — M25531 Pain in right wrist: Secondary | ICD-10-CM

## 2017-11-26 DIAGNOSIS — S63501A Unspecified sprain of right wrist, initial encounter: Secondary | ICD-10-CM | POA: Diagnosis not present

## 2017-11-26 MED ORDER — TRAMADOL HCL 50 MG PO TABS: 50.0000 mg | ORAL_TABLET | Freq: Four times a day (QID) | ORAL | 0 refills | Status: DC | PRN

## 2017-11-26 NOTE — ED Provider Notes (Signed)
[facility]   [id number] 11/26/17 Arrival Time: 1026  ASSESSMENT & PLAN:  1. Right wrist pain   2. Sprain of right wrist, initial encounter     Imaging: Dg Wrist Complete Right  Result Date: 11/26/2017 CLINICAL DATA:  The patient felt a pop in the right wrist with onset of pain and swelling when she opened a door 11/23/2017. Initial encounter. EXAM: RIGHT WRIST - COMPLETE 3+ VIEW COMPARISON:  None. FINDINGS: There is no evidence of fracture or dislocation. There is no evidence of arthropathy or other focal bone abnormality. Soft tissues are unremarkable. IMPRESSION: Negative exam. Electronically Signed   By: Drusilla Kannerhomas  Dalessio M.D.   On: 11/26/2017 11:49    Meds ordered this encounter  Medications  . traMADol (ULTRAM) 50 MG tablet    Sig: Take 1 tablet (50 mg total) by mouth every 6 (six) hours as needed.    Dispense:  15 tablet    Refill:  0   Thumb spica splint for the next 1-2 weeks. Work note given. Will f/u with PCP if not improving.  [place] Controlled Substances Registry consulted for this patient. I feel the risk/benefit ratio today is favorable for proceeding with this prescription for a controlled substance. Medication sedation precautions given.  Reviewed expectations re: course of current medical issues. Questions answered. Outlined signs and symptoms indicating need for more acute intervention. Patient verbalized understanding. After Visit Summary given.  SUBJECTIVE: History from: patient. Cathy Yang is a 32 y.o. female who reports:  Reports  localized pain of her right wrist and thumb; fairly persistent; described as aching without radiation Onset: abrupt, yesterday Injury/trama: reports feeling pain near base of thumb and in wrist when she tried to open a freezer door; no direct trauma Severity: moderate Progression: stable Relieved by: "not moving my wrist" Worsened by: certain movements Associated symptoms: none reproted Extremity sensation changes  or weakness: none Self treatment: Tylenol without relief  History of similar: no  ROS: As per HPI.   OBJECTIVE:  Vitals:   11/26/17 1129  BP: (!) 155/111  Pulse: 82  Resp: 18  Temp: 98.6 F (37 C)  TempSrc: Oral  SpO2: 99%    General appearance: alert; no distress Extremities: no cyanosis or edema; symmetrical with no gross deformities; pain reported over radial side of wrist, more notable with thumb and wrist movement; no erythema or inflammation; no swelling; FROM; very tender over radial styloid CV: normal extremity capillary refill Skin: warm and dry Neurologic: normal gait; normal symmetric reflexes in all extremities; normal sensation in all extremities Psychological: alert and cooperative; normal mood and affect  Allergies  Allergen Reactions  . Nsaids     Past Medical History:  Diagnosis Date  . Hypertension   . Scoliosis    Social History   Socioeconomic History  . Marital status: Single    Spouse name: Not on file  . Number of children: Not on file  . Years of education: Not on file  . Highest education level: Not on file  Social Needs  . Financial resource strain: Not on file  . Food insecurity - worry: Not on file  . Food insecurity - inability: Not on file  . Transportation needs - medical: Not on file  . Transportation needs - non-medical: Not on file  Occupational History  . Not on file  Tobacco Use  . Smoking status: Never Smoker  . Smokeless tobacco: Never Used  Substance and Sexual Activity  . Alcohol use: Yes  Comment: socially  . Drug use: No  . Sexual activity: Yes  Other Topics Concern  . Not on file  Social History Narrative  . Not on file   Family History  Problem Relation Age of Onset  . Hypertension Mother   . Diabetes Mother   . Cancer Other    Past Surgical History:  Procedure Laterality Date  . CESAREAN SECTION    . FIXATION KYPHOPLASTY    . LAPAROSCOPIC GASTRIC SLEEVE RESECTION N/A 10/08/2017   Procedure:  LAPAROSCOPIC GASTRIC SLEEVE RESECTION WITH UPPER ENDOSCOPY;  Surgeon: Luretha Murphy, MD;  Location: [facility];  Service: General;  Laterality: Vertis Kelch, MD 11/26/17 1206

## 2017-11-26 NOTE — ED Triage Notes (Signed)
Pt states she was at work Friday night and was opening a large door and felt her R wrist pop. R wrist has been swollen and painful ever since. Ice pack applied.

## 2017-12-05 ENCOUNTER — Encounter: Payer: 59 | Attending: Surgery | Admitting: Registered"

## 2017-12-05 DIAGNOSIS — Z6841 Body Mass Index (BMI) 40.0 and over, adult: Secondary | ICD-10-CM | POA: Diagnosis not present

## 2017-12-05 DIAGNOSIS — R030 Elevated blood-pressure reading, without diagnosis of hypertension: Secondary | ICD-10-CM | POA: Diagnosis not present

## 2017-12-05 DIAGNOSIS — Z79899 Other long term (current) drug therapy: Secondary | ICD-10-CM | POA: Insufficient documentation

## 2017-12-05 DIAGNOSIS — Z713 Dietary counseling and surveillance: Secondary | ICD-10-CM | POA: Insufficient documentation

## 2017-12-05 DIAGNOSIS — E7800 Pure hypercholesterolemia, unspecified: Secondary | ICD-10-CM | POA: Insufficient documentation

## 2017-12-05 DIAGNOSIS — E669 Obesity, unspecified: Secondary | ICD-10-CM

## 2017-12-05 NOTE — Progress Notes (Signed)
Follow-up visit:  8 Weeks Post-Operative Sleeve Gastrectomy Surgery  Medical Nutrition Therapy:  Appt start time: 8:09 end time:  8:55.  Primary concerns today: Post-operative Bariatric Surgery Nutrition Management.  Non scale victories: more energy, no longer craving certain foods, sees food as something she needs for the health of her body to prevent getting sick  Surgery date: 10/08/2017 Surgery type: Sleeve [facility] at [facility]: 338.4 Weight today: 293.0 Weight change: 26.4 lbs loss from 319.4 (10/24/2017) Total weight lost: 45.4 lbs Weight loss goal: create a healthy lifestyle, lose weight   TANITA  BODY COMP RESULTS  10/24/2017 12/05/2017   BMI (kg/m^2) 53.2 48.8   Fat Mass (lbs) 180.8 153.2   Fat Free Mass (lbs) 138.6 139.8   Total Body Water (lbs) 104.8 104.6   Pt states she only eats chicken and fish, no salmon. Pt states she drinks protein shakes when she doesn't feel like eating. Pt states she wants to start using more recipes. Pt states she works 3rd shift and has a challenging time drinking enough fluids at night. Pt reports typical schedule: 9am-2pm, 7:30-9pm sleep 6.5 hrs 9:30pm work 12am break eats 1 oz meat  3am break 1-2 oz [place] chili  6am off work, 1-2 oz meat Pt reports not liking Celebrate multivitamin. Pt was given sample of [employer] chewable multivitamin Lot # [id number] Exp 04/2019  Preferred Learning Style:   No preference indicated   Learning Readiness:   Ready  Change in progress  24-hr recall: B (AM): 3 spoonfuls [place] chili (7-14g) Snk (AM): none  L (PM): 1-2 oz shrimp/chicken (7-14g) Snk (PM): none  D (PM): 1-2 oz shrimp/chicken (7-14g) Snk (PM):  3 spoonfuls [place] chili (14g)  Fluid intake: water; averages ~56 ounces Estimated total protein intake: ~56 grams  Medications: See list;  Supplementation: Celebrate + 3 TUMS  Using straws: no Drinking while eating: no Having you been chewing well: yes Chewing/swallowing  difficulties: only with dry meat and salmon Changes in vision: no Changes to mood/headaches: headaches sometimes Hair loss/Changes to skin/Changes to nails: no, no, no Any difficulty focusing or concentrating: no Sweating: only when BS drops Dizziness/Lightheaded: only when BS drops Palpitations: no Carbonated beverages: no N/V/D/C/GAS: no, no, no, yes-taking Miralax, no Abdominal Pain: no Dumping syndrome: no Last Lap-Band fill: N/A  Recent physical activity:  Walking 25-30 min, 5 days/week  Progress Towards Goal(s):  In progress.  Handouts given during visit include:  Phase IV: High Protein + NS vegetables   Nutritional Diagnosis:  [place]-3.3 Overweight/obesity related to past poor dietary habits and physical inactivity as evidenced by patient w/ recent sleeve gastrectomy surgery following dietary guidelines for continued weight loss.     Intervention:  Nutrition education and counseling. Goals:  Follow Phase 3B: High Protein + Non-Starchy Vegetables  Eat 3-6 small meals/snacks, every 3-5 hrs  Increase lean protein foods to meet 60g goal  Increase fluid intake to 64oz +  Avoid drinking 15 minutes before, during and 30 minutes after eating  Aim for >30 min of physical activity daily  - Attend BELT program, starting Monday 2/18.  - Try [employer] multivitamin. Order if you like it. Contact information is on package.  - Track protein intake using Baritastic App or MyFitnessPal to keep track of protein throughout the day.   Teaching Method Utilized:  Visual Auditory Hands on  Barriers to learning/adherence to lifestyle change: none identified  Demonstrated degree of understanding via:  Teach Back   Monitoring/Evaluation:  Dietary intake, exercise, lap  band fills, and body weight. Follow up in 2 months for 4 month post-op visit.

## 2017-12-05 NOTE — Patient Instructions (Signed)
Goals:  Follow Phase 3B: High Protein + Non-Starchy Vegetables  Eat 3-6 small meals/snacks, every 3-5 hrs  Increase lean protein foods to meet 60g goal  Increase fluid intake to 64oz +  Avoid drinking 15 minutes before, during and 30 minutes after eating  Aim for >30 min of physical activity daily  - Attend BELT program, starting Monday 2/18.   - Try [employer] multivitamin. Order if you like it. Contact information is on package.   - Track protein intake using Baritastic App or MyFitnessPal to keep track of protein throughout the day.

## 2018-02-01 ENCOUNTER — Other Ambulatory Visit: Payer: Self-pay | Admitting: Surgery

## 2018-02-01 DIAGNOSIS — Z903 Acquired absence of stomach [part of]: Secondary | ICD-10-CM

## 2018-02-04 ENCOUNTER — Other Ambulatory Visit: Payer: Self-pay | Admitting: Surgery

## 2018-02-04 ENCOUNTER — Ambulatory Visit
Admission: RE | Admit: 2018-02-04 | Discharge: 2018-02-04 | Disposition: A | Discharge status: Home / Self Care | Payer: 59 | Source: Ambulatory Visit | Attending: Surgery | Admitting: Surgery

## 2018-02-04 DIAGNOSIS — Z903 Acquired absence of stomach [part of]: Secondary | ICD-10-CM

## 2018-02-05 ENCOUNTER — Encounter: Payer: Self-pay | Admitting: Registered"

## 2018-02-05 ENCOUNTER — Encounter: Payer: 59 | Attending: Surgery | Admitting: Registered"

## 2018-02-05 DIAGNOSIS — R030 Elevated blood-pressure reading, without diagnosis of hypertension: Secondary | ICD-10-CM | POA: Diagnosis not present

## 2018-02-05 DIAGNOSIS — E7800 Pure hypercholesterolemia, unspecified: Secondary | ICD-10-CM | POA: Diagnosis not present

## 2018-02-05 DIAGNOSIS — Z79899 Other long term (current) drug therapy: Secondary | ICD-10-CM | POA: Diagnosis not present

## 2018-02-05 DIAGNOSIS — Z6841 Body Mass Index (BMI) 40.0 and over, adult: Secondary | ICD-10-CM | POA: Diagnosis not present

## 2018-02-05 DIAGNOSIS — Z713 Dietary counseling and surveillance: Secondary | ICD-10-CM | POA: Diagnosis not present

## 2018-02-05 DIAGNOSIS — E669 Obesity, unspecified: Secondary | ICD-10-CM

## 2018-02-05 NOTE — Patient Instructions (Addendum)
-   Try [employer] or [employer] capsule multivitamin and mix with food.   - Try to increase fluid intake to drinking water while driving: to and from work/school.   - Fluid goal is 64 ounces a day. Can include sugar-free popsicles and sugar-free jello.   - Increase fiber intake with non-starchy vegetables such as broccoli, asparagus, brussels sprouts, etc.

## 2018-02-05 NOTE — Progress Notes (Signed)
Follow-up visit:  4 Months Post-Operative Sleeve Gastrectomy Surgery  Medical Nutrition Therapy:  Appt start time: 9:15 end time:  9:45.  Primary concerns today: Post-operative Bariatric Surgery Nutrition Management.  Non scale victories: more energy, no longer craving certain foods, sees food as something she needs for the health of her body to prevent getting sick,   Surgery date: 10/08/2017 Surgery type: Sleeve Start weight at [facility]: 338.4 Weight today: 274.8  Weight change: 18.2 lbs loss from 293.0 (12/05/2017) Total weight lost: 63.6 lbs Weight loss goal: create a healthy lifestyle, lose weight   TANITA  BODY COMP RESULTS  10/24/2017 12/05/2017 02/05/2018   BMI (kg/m^2) 53.2 48.8 45.7   Fat Mass (lbs) 180.8 153.2 139.8   Fat Free Mass (lbs) 138.6 139.8 135.0   Total Body Water (lbs) 104.8 104.6 100.4   Pt states she had upper GI done yesterday revealing  hernia, GERD, acid reflux; states alot of things are not staying down. Pt states she did not have GERD or reflux issues before surgery. Pt states she craves sugar, eats sugar-free cinnamon and sugar-free wafers. Pt states her energy fluctuates. Pt states she cannot tolerate salmon and sometimes shrimp. Pt states she can tolerate chicken, crab legs, oysters as well as broccoli, asparagus, and brussels sprouts. Pt states she has bowel movements 2 times/week. Pt states she tried 32 oz smoothie (non-fat greek yogurt, flaxseeds, protein, kale, spinach, pineapple) from [employer].   Pt states she works 3rd shift and has a challenging time drinking enough fluids at night because she is not allowed to drink while working; focuses more on eating protein. Pt reports typical schedule: 10am-2pm, 7:30-9pm sleep 6.5 hrs 9:30pm work 12am break eats 1 oz meat  3am break 1-2 oz [place] chili  6am off work, 1-2 oz meat 3:30pm  Pt reports not [employer] chewable multivitamins.   Preferred Learning Style:   No preference  indicated   Learning Readiness:   Ready  Change in progress  24-hr recall: B (AM): cheese, almonds, cranberries (13g) pickles or 3 spoonfuls [place] chili (7-14g) Snk (AM): none  L (PM): 1-2 oz chicken (7-14g) or protein shake (30g) Snk (PM): none  D (PM): egg (6g), sausage (3g) or 1-2 oz shrimp/chicken (7-14g) Snk (PM): 1-2 oz chicken (21g) or 3 spoonfuls [place] chili (14g)  Fluid intake: water; averages ~40 ounces Estimated total protein intake: ~57 grams  Medications: See list;  Supplementation: Celebrate + 3 TUMS  Using straws: no Drinking while eating: no Having you been chewing well: yes Chewing/swallowing difficulties: no  Changes in vision: no Changes to mood/headaches: headaches sometimes Hair loss/Changes to skin/Changes to nails: no, no, no Any difficulty focusing or concentrating: no Sweating: no  Dizziness/Lightheaded: only when BS drops Palpitations: no Carbonated beverages: no N/V/D/C/GAS: yes, yes, no, yes-taking Miralax, no Abdominal Pain: no Dumping syndrome: no Last Lap-Band fill: N/A  Recent physical activity:  Walking 15 min, 4-5 days/week  Progress Towards Goal(s):  In progress.  Handouts given during visit include:  none   Nutritional Diagnosis:  [place]-3.3 Overweight/obesity related to past poor dietary habits and physical inactivity as evidenced by patient w/ recent sleeve gastrectomy surgery following dietary guidelines for continued weight loss.     Intervention:  Nutrition education and counseling. Pt was educated and counseled on how to manage GERD, ways to increase fluid intake as well as fiber intake, and how to incorporate capsule multivitamins into her day.  Goals: - Try [employer] or [employer]urity capsule multivitamin  and mix with food.  - Try to increase fluid intake to drinking water while driving: to and from work/school.  - Fluid goal is 64 ounces a day. Can include sugar-free popsicles and sugar-free jello.  - Increase fiber  intake with non-starchy vegetables such as broccoli, asparagus, brussels sprouts, etc.   Teaching Method Utilized:  Visual Auditory Hands on  Barriers to learning/adherence to lifestyle change: none identified  Demonstrated degree of understanding via:  Teach Back   Monitoring/Evaluation:  Dietary intake, exercise, lap band fills, and body weight. Follow up in 2 months for 6 month post-op visit.

## 2018-04-08 ENCOUNTER — Ambulatory Visit: Payer: Self-pay | Admitting: Registered"

## 2018-04-13 ENCOUNTER — Other Ambulatory Visit: Payer: Self-pay | Admitting: Student

## 2018-04-13 DIAGNOSIS — R112 Nausea with vomiting, unspecified: Secondary | ICD-10-CM

## 2018-04-15 ENCOUNTER — Ambulatory Visit: Payer: Self-pay | Admitting: Registered"

## 2018-04-29 ENCOUNTER — Ambulatory Visit
Admission: RE | Admit: 2018-04-29 | Discharge: 2018-04-29 | Disposition: A | Discharge status: Home / Self Care | Payer: 59 | Source: Ambulatory Visit | Attending: Student | Admitting: Student

## 2018-04-29 DIAGNOSIS — R112 Nausea with vomiting, unspecified: Secondary | ICD-10-CM

## 2018-05-23 ENCOUNTER — Ambulatory Visit: Payer: Self-pay | Admitting: Surgery

## 2018-05-23 NOTE — H&P (Signed)
Chief Complaint:  Upper abdominal pain on the right with nausea vomiting intermittent  History of Present Illness:  Cathy Yang is an 32 y.o. female who is about 7 months status post sleeve gastrectomy.  Postoperatively she's had intermittent nausea and vomiting and right upper side abdominal pain.  Ultrasound demonstrates gallstones and she felt like she is having chronic cholecystitis aggravating some gastroesophageal reflux.  She is brought in at this time for laparoscopic cholecystectomy.  Past Medical History:  Diagnosis Date  . Hypertension   . Scoliosis     Past Surgical History:  Procedure Laterality Date  . CESAREAN SECTION    . FIXATION KYPHOPLASTY    . LAPAROSCOPIC GASTRIC SLEEVE RESECTION N/A 10/08/2017   Procedure: LAPAROSCOPIC GASTRIC SLEEVE RESECTION WITH UPPER ENDOSCOPY;  Surgeon: Luretha MurphyMartin, Thaddaeus Granja, MD;  Location: [facility];  Service: General;  Laterality: N/A;    Current Outpatient Medications  Medication Sig Dispense Refill  . acetaminophen (TYLENOL) 500 MG tablet Take 1,000 mg by mouth every 6 (six) hours as needed for moderate pain or headache.    . benzonatate (TESSALON) 100 MG capsule Take 1 capsule (100 mg total) by mouth every 8 (eight) hours. (Patient not taking: Reported on 04/24/2017) 21 capsule 0  . busPIRone (BUSPAR) 7.5 MG tablet Take 7.5 mg by mouth 3 (three) times daily.    Marland Kitchen. dicyclomine (BENTYL) 20 MG tablet Take 1 tablet (20 mg total) by mouth 2 (two) times daily. (Patient not taking: Reported on 04/24/2017) 20 tablet 0  . ergocalciferol (VITAMIN D2) 50000 units capsule Take 50,000 Units by mouth every Monday.    Marland Kitchen. FLUoxetine (PROZAC) 10 MG capsule Take 10 mg by mouth daily.    . Multiple Vitamins-Minerals (CELEBRATE MULTI-COMPLETE 18 PO) Take by mouth.    . Nitrofurantoin Monohyd Macro (MACROBID PO) Take by mouth.    . ondansetron (ZOFRAN) 4 MG tablet Take 1 tablet (4 mg total) by mouth every 6 (six) hours. (Patient not taking: Reported on 04/24/2017) 12 tablet  0  . ondansetron (ZOFRAN-ODT) 4 MG disintegrating tablet Take 4 mg by mouth every 8 (eight) hours as needed for nausea or vomiting.    . Pantoprazole Sodium (PROTONIX PO) Take by mouth.    . spironolactone (ALDACTONE) 100 MG tablet Take 100 mg by mouth daily.    . sucralfate (CARAFATE) 1 GM/10ML suspension Take 10 mLs (1 g total) by mouth 4 (four) times daily -  with meals and at bedtime. (Patient not taking: Reported on 04/24/2017) 420 mL 0  . traMADol (ULTRAM) 50 MG tablet Take 1 tablet (50 mg total) by mouth every 6 (six) hours as needed. 15 tablet 0   No current facility-administered medications for this visit.    Nsaids Family History  Problem Relation Age of Onset  . Hypertension Mother   . Diabetes Mother   . Cancer Other    Social History:   reports that she has never smoked. She has never used smokeless tobacco. She reports that she drinks alcohol. She reports that she does not use drugs.   REVIEW OF SYSTEMS : Negative except for reflux  Physical Exam:   There were no vitals taken for this visit. There is no height or weight on file to calculate BMI.  Gen:  WDWN African-American female NAD  Neurological: Alert and oriented to person, place, and time. Motor and sensory function is grossly intact  Head: Normocephalic and atraumatic.  Eyes: Conjunctivae are normal. Pupils are equal, round, and reactive to light. No scleral icterus.  Neck: Normal range of motion. Neck supple. No tracheal deviation or thyromegaly present.  Cardiovascular:  SR without murmurs or gallops.  No carotid bruits Breast:  Not examined Respiratory: Effort normal.  No respiratory distress. No chest wall tenderness. Breath sounds normal.  No wheezes, rales or rhonchi.  Abdomen:  Moderately obese with well-healed incisions GU:  unremarkable Musculoskeletal: Normal range of motion. Extremities are nontender. No cyanosis, edema or clubbing noted Lymphadenopathy: No cervical, preauricular, postauricular or  axillary adenopathy is present Skin: Skin is warm and dry. No rash noted. No diaphoresis. No erythema. No pallor. Pscyh: Normal mood and affect. Behavior is normal. Judgment and thought content normal.   LABORATORY RESULTS: No results found for this or any previous visit (from the past 48 hour(s)).   RADIOLOGY RESULTS: No results found.  Problem List: Patient Active Problem List   Diagnosis Date Noted  . 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    Assessment & Plan: 32 year old female BMI of 46 prior sleeve gastrectomy in with symptomatic gallstones.  Plan laparoscopic cholecystectomy with intraoperative cholangiogram.  I have explained procedure to the patient and she is ready to proceed    Matt B. [name], MD, [facility] [phone] beeper [phone]  05/23/2018 10:53 AM

## 2018-06-26 ENCOUNTER — Encounter (HOSPITAL_COMMUNITY): Payer: Self-pay

## 2018-06-26 ENCOUNTER — Emergency Department (HOSPITAL_COMMUNITY): Payer: 59

## 2018-06-26 ENCOUNTER — Telehealth: Payer: Self-pay | Admitting: Surgery

## 2018-06-26 ENCOUNTER — Emergency Department (HOSPITAL_COMMUNITY)
Admission: EM | Admit: 2018-06-26 | Discharge: 2018-06-27 | Disposition: A | Discharge status: Home / Self Care | Payer: 59 | Attending: Emergency Medicine | Admitting: Emergency Medicine

## 2018-06-26 DIAGNOSIS — Z79899 Other long term (current) drug therapy: Secondary | ICD-10-CM | POA: Diagnosis not present

## 2018-06-26 DIAGNOSIS — K8020 Calculus of gallbladder without cholecystitis without obstruction: Secondary | ICD-10-CM | POA: Insufficient documentation

## 2018-06-26 DIAGNOSIS — I10 Essential (primary) hypertension: Secondary | ICD-10-CM | POA: Insufficient documentation

## 2018-06-26 DIAGNOSIS — Z9884 Bariatric surgery status: Secondary | ICD-10-CM | POA: Insufficient documentation

## 2018-06-26 DIAGNOSIS — R1011 Right upper quadrant pain: Secondary | ICD-10-CM | POA: Diagnosis present

## 2018-06-26 DIAGNOSIS — K8050 Calculus of bile duct without cholangitis or cholecystitis without obstruction: Secondary | ICD-10-CM

## 2018-06-26 LAB — CBC
HCT: 41.5 % (ref 36.0–46.0)
Hemoglobin: 13.6 g/dL (ref 12.0–15.0)
MCH: 29.6 pg (ref 26.0–34.0)
MCHC: 32.8 g/dL (ref 30.0–36.0)
MCV: 90.4 fL (ref 78.0–100.0)
Platelets: 346 10*3/uL (ref 150–400)
RBC: 4.59 MIL/uL (ref 3.87–5.11)
RDW: 14.7 % (ref 11.5–15.5)
WBC: 4.6 10*3/uL (ref 4.0–10.5)

## 2018-06-26 LAB — COMPREHENSIVE METABOLIC PANEL
ALT: 14 U/L (ref 0–44)
AST: 15 U/L (ref 15–41)
Albumin: 3.5 g/dL (ref 3.5–5.0)
Alkaline Phosphatase: 85 U/L (ref 38–126)
Anion gap: 6 (ref 5–15)
BUN: 9 mg/dL (ref 6–20)
CHLORIDE: 107 mmol/L (ref 98–111)
CO2: 28 mmol/L (ref 22–32)
Calcium: 9 mg/dL (ref 8.9–10.3)
Creatinine, Ser: 0.6 mg/dL (ref 0.44–1.00)
GFR calc Af Amer: >60 mL/min (ref >60)
Glucose, Bld: 84 mg/dL (ref 70–99)
POTASSIUM: 3.9 mmol/L (ref 3.5–5.1)
SODIUM: 141 mmol/L (ref 135–145)
Total Bilirubin: 0.8 mg/dL (ref 0.3–1.2)
Total Protein: 7.3 g/dL (ref 6.5–8.1)

## 2018-06-26 LAB — I-STAT BETA HCG BLOOD, ED (MC, WL, AP ONLY): I-stat hCG, quantitative: <5 m[IU]/mL (ref <5)

## 2018-06-26 LAB — LIPASE, BLOOD: LIPASE: 30 U/L (ref 11–51)

## 2018-06-26 NOTE — Discharge Instructions (Addendum)
Follow-up with Dr. [name] for further evaluation.  As

## 2018-06-26 NOTE — Telephone Encounter (Signed)
Cathy Yang  June 18, 1986 [id number]  Patient Care Team: Marlinda Mike, CNM as PCP - General (Obstetrics and Gynecology)  This patient is a 32 y.o.female who calls today for surgical evaluation.   Reason for call: Worsening nausea and vomiting.  Concern for another gallbladder attack  Female with history of prior sleeve gastrectomy last December Dr. [name] with our group..  Intermittent episodes of biliary colic.  Seen by her surgeon, Dr. [name] on 01Aug.  Symptomatic gallstones suspected.  Cholecystectomy offered.  Patient has been working to try and get money for the down payment and has not scheduled yet.  However, she notes she has been having more difficulty with nausea and vomiting and some pain today.  She called the since evening with concerns of not able to keep things down well today despite nausea medicines.  I recommend the patient consider more urgent evaluation.  If she truly cannot keep anything down her pain is unbearable, she may need to be evaluated in the emergency room to see if her attack can be controlled better with IV fluids and medications.  If not, would she require admission and urgent cholecystectomy.  She is considering this.  Patient Active Problem List   Diagnosis Date Noted  . 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 Hypertension   . Scoliosis     Past Surgical History:  Procedure Laterality Date  . CESAREAN SECTION    . FIXATION KYPHOPLASTY    . LAPAROSCOPIC GASTRIC SLEEVE RESECTION N/A 10/08/2017   Procedure: LAPAROSCOPIC GASTRIC SLEEVE RESECTION WITH UPPER ENDOSCOPY;  Surgeon: Luretha Murphy, MD;  Location: [facility];  Service: General;  Laterality: N/A;    Social History   Socioeconomic History  . Marital status: Single    Spouse name: Not on file  . Number of children: Not on file  . Years of education: Not on file  . Highest education level: Not on file  Occupational History  . Not on  file  Social Needs  . Financial resource strain: Not on file  . Food insecurity:    Worry: Not on file    Inability: Not on file  . Transportation needs:    Medical: Not on file    Non-medical: Not on file  Tobacco Use  . Smoking status: Never Smoker  . Smokeless tobacco: Never Used  Substance and Sexual Activity  . Alcohol use: Yes    Comment: socially  . Drug use: No  . Sexual activity: Yes  Lifestyle  . Physical activity:    Days per week: Not on file    Minutes per session: Not on file  . Stress: Not on file  Relationships  . Social connections:    Talks on phone: Not on file    Gets together: Not on file    Attends religious service: Not on file    Active member of club or organization: Not on file    Attends meetings of clubs or organizations: Not on file    Relationship status: Not on file  . Intimate partner violence:    Fear of current or ex partner: Not on file    Emotionally abused: Not on file    Physically abused: Not on file    Forced sexual activity: Not on file  Other Topics Concern  . Not on file  Social History Narrative  . Not on file    Family History  Problem Relation Age of Onset  .  Hypertension Mother   . Diabetes Mother   . Cancer Other     Current Outpatient Medications  Medication Sig Dispense Refill  . acetaminophen (TYLENOL) 500 MG tablet Take 1,000 mg by mouth every 6 (six) hours as needed for moderate pain or headache.    . benzonatate (TESSALON) 100 MG capsule Take 1 capsule (100 mg total) by mouth every 8 (eight) hours. (Patient not taking: Reported on 04/24/2017) 21 capsule 0  . busPIRone (BUSPAR) 7.5 MG tablet Take 7.5 mg by mouth 3 (three) times daily.    Marland Kitchen dicyclomine (BENTYL) 20 MG tablet Take 1 tablet (20 mg total) by mouth 2 (two) times daily. (Patient not taking: Reported on 04/24/2017) 20 tablet 0  . ergocalciferol (VITAMIN D2) 50000 units capsule Take 50,000 Units by mouth every Monday.    Marland Kitchen FLUoxetine (PROZAC) 10 MG capsule  Take 10 mg by mouth daily.    . Multiple Vitamins-Minerals (CELEBRATE MULTI-COMPLETE 18 PO) Take by mouth.    . Nitrofurantoin Monohyd Macro (MACROBID PO) Take by mouth.    . ondansetron (ZOFRAN) 4 MG tablet Take 1 tablet (4 mg total) by mouth every 6 (six) hours. (Patient not taking: Reported on 04/24/2017) 12 tablet 0  . ondansetron (ZOFRAN-ODT) 4 MG disintegrating tablet Take 4 mg by mouth every 8 (eight) hours as needed for nausea or vomiting.    . Pantoprazole Sodium (PROTONIX PO) Take by mouth.    . spironolactone (ALDACTONE) 100 MG tablet Take 100 mg by mouth daily.    . sucralfate (CARAFATE) 1 GM/10ML suspension Take 10 mLs (1 g total) by mouth 4 (four) times daily -  with meals and at bedtime. (Patient not taking: Reported on 04/24/2017) 420 mL 0  . traMADol (ULTRAM) 50 MG tablet Take 1 tablet (50 mg total) by mouth every 6 (six) hours as needed. 15 tablet 0   No current facility-administered medications for this visit.      Allergies  Allergen Reactions  . Nsaids     @VS @  No results found.  Note: This dictation was prepared with Dragon/digital dictation along with [employer]. Any transcriptional errors that result from this process are unintentional.   .Ardeth Sportsman, M.D., F.A.C.S. Gastrointestinal and Minimally Invasive Surgery [facility] [address] [place], [place] [address] [phone] Main / Paging  06/26/2018 7:43 PM

## 2018-06-26 NOTE — ED Provider Notes (Signed)
[facility]-EMERGENCY DEPT Provider Note   CSN: [id number] Arrival date & time: 06/26/18  1930     History   Chief Complaint Chief Complaint  Patient presents with  . Abdominal Pain    HPI Charne Willard is a 32 y.o. female.  HPI Patient presents with right upper quadrant pain.  Had a gastric sleeve and since then developed gallstones and problems with eating.  Has had previous ultrasound that showed gallstones.  Has seen Dr. [name] to have her gallbladder out but she states they wanted $800.  Still has her gallbladder.  Has gotten worse with eating.  Has been vomiting today.  States with certain foods will get worse.  States it is worse with fatty foods when she eats food she is not supposed to eat. Past Medical History:  Diagnosis Date  . Hypertension   . Scoliosis     Patient Active Problem List   Diagnosis Date Noted  . 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 CESAREAN SECTION    . FIXATION KYPHOPLASTY    . LAPAROSCOPIC GASTRIC SLEEVE RESECTION N/A 10/08/2017   Procedure: LAPAROSCOPIC GASTRIC SLEEVE RESECTION WITH UPPER ENDOSCOPY;  Surgeon: Luretha Murphy, MD;  Location: [facility] ORS;  Service: General;  Laterality: N/A;     OB History   None      Home Medications    Prior to Admission medications   Medication Sig Start Date End Date Taking? Authorizing Provider  acetaminophen (TYLENOL) 500 MG tablet Take 1,000 mg by mouth every 6 (six) hours as needed for moderate pain or headache.   Yes [provider]  busPIRone (BUSPAR) 7.5 MG tablet Take 7.5 mg by mouth 3 (three) times daily.   Yes [provider]  Multiple Vitamins-Minerals (CELEBRATE MULTI-COMPLETE 18 PO) Take 1 tablet by mouth 3 (three) times daily.    Yes [provider]  ondansetron (ZOFRAN-ODT) 4 MG disintegrating tablet Take 4 mg by mouth every 8 (eight) hours as needed for nausea or vomiting.    Yes [provider]  spironolactone (ALDACTONE) 100 MG tablet Take 100 mg by mouth daily.   Yes [provider]  sucralfate (CARAFATE) 1 GM/10ML suspension Take 10 mLs (1 g total) by mouth 4 (four) times daily -  with meals and at bedtime. 10/10/16  Yes Long, Arlyss Repress, MD  sucralfate (CARAFATE) 1 GM/10ML suspension Take 1 g by mouth 4 (four) times daily -  with meals and at bedtime.   Yes [provider]  benzonatate (TESSALON) 100 MG capsule Take 1 capsule (100 mg total) by mouth every 8 (eight) hours. Patient not taking: Reported on 04/24/2017 12/01/16   Deatra Canter, FNP  dicyclomine (BENTYL) 20 MG tablet Take 1 tablet (20 mg total) by mouth 2 (two) times daily. Patient not taking: Reported on 04/24/2017 10/10/16   Long, Arlyss Repress, MD  ondansetron (ZOFRAN) 4 MG tablet Take 1 tablet (4 mg total) by mouth every 6 (six) hours. Patient not taking: Reported on 04/24/2017 10/10/16   Long, Arlyss Repress, MD  traMADol (ULTRAM) 50 MG tablet Take 1 tablet (50 mg total) by mouth every 6 (six) hours as needed. Patient not taking: Reported on 06/26/2018 11/26/17   Mardella Layman, MD    Family History Family History  Problem Relation Age of Onset  . Hypertension Mother   . Diabetes Mother   . Cancer Other     Social History Social History  Tobacco Use  . Smoking status: Never Smoker  . Smokeless tobacco: Never Used  Substance Use Topics  . Alcohol use: Yes    Comment: socially  . Drug use: No     Allergies   Nsaids   Review of Systems Review of Systems  Constitutional: Negative for appetite change.  HENT: Negative for congestion.   Respiratory: Negative for shortness of breath.   Cardiovascular: Negative for chest pain.  Gastrointestinal: Positive for abdominal pain, nausea and vomiting.  Genitourinary: Negative for dysuria.  Musculoskeletal: Negative for back pain.  Skin: Negative for rash and wound.  Neurological: Negative for weakness.  Psychiatric/Behavioral:  Negative for confusion.     Physical Exam Updated Vital Signs BP (!) 132/94 (BP Location: Right Arm)   Pulse 64   Temp 97.6 F (36.4 C) (Oral)   Resp 18   SpO2 100%   Physical Exam  Constitutional: She appears well-developed.  HENT:  Head: Normocephalic.  Cardiovascular: Regular rhythm.  Abdominal: Normal appearance.  Right upper quadrant tenderness.  No rebound or guarding.  No hernia palpated.  Neurological: She is alert.  Skin: Skin is warm. Capillary refill takes less than 2 seconds.     ED Treatments / Results  Labs (all labs ordered are listed, but only abnormal results are displayed) Labs Reviewed  LIPASE, BLOOD  COMPREHENSIVE METABOLIC PANEL  CBC  URINALYSIS, ROUTINE W REFLEX MICROSCOPIC  I-STAT BETA HCG BLOOD, ED ([facility], [facility], [facility] ONLY)    EKG None  Radiology US Abdomen Limited  Result Date: 06/26/2018 CLINICAL DATA:  Right upper quadrant pain EXAM: ULTRASOUND ABDOMEN LIMITED RIGHT UPPER QUADRANT COMPARISON:  04/29/2018 FINDINGS: Gallbladder: Contracted appearing gallbladder. Shadowing stone measuring up to 2.3 cm. Negative sonographic Murphy. Slight increased wall thickness at 3.6 mm. Common bile duct: Diameter: 2.3 mm Liver: No focal lesion identified. Within normal limits in parenchymal echogenicity. Portal vein is patent on color Doppler imaging with normal direction of blood flow towards the liver. IMPRESSION: 1. Contracted gallbladder containing a shadowing stone. Slight increased wall thickness, likely due to contracted appearance of the gallbladder. Negative sonographic Murphy. No biliary dilatation. Electronically Signed   By: Jasmine Pang M.D.   On: 06/26/2018 23:43    Procedures Procedures (including critical care time)  Medications Ordered in ED Medications - No data to display   Initial Impression / Assessment and Plan / ED Course  I have reviewed the triage vital signs and the nursing notes.  Pertinent labs & imaging results that were available  during my care of the patient were reviewed by me and considered in my medical decision making (see chart for details).     Patient with abdominal pain.  Known gallstones.  Ultrasound shows stones and contracted gallbladder but no pericholecystic fluid.  Labs reassuring.  Already has seen Dr. [name] from general surgery.  Will follow up with him.  Final Clinical Impressions(s) / ED Diagnoses   Final diagnoses:  Recurrent biliary colic    ED Discharge Orders    None       Benjiman Core, MD 06/27/18 0003

## 2018-06-26 NOTE — ED Triage Notes (Signed)
Pt complains of right upper quadrant pain since this am, she states that she's been having trouble with her gallbladder and this time is worse Pt states that she's been vomiting today

## 2018-06-28 NOTE — Patient Instructions (Addendum)
Cathy Yang  06/28/2018   Your procedure is scheduled on: 07-10-18  Report to [facility] Main  Entrance  Report to admitting at  630 AM    Call this number if you have problems the morning of surgery [phone]   Remember: Do not eat food  :After Midnight.  NO SOLID FOOD AFTER MIDNIGHT THE NIGHT PRIOR TO SURGERY. NOTHING BY MOUTH EXCEPT CLEAR LIQUIDS UNTIL 3 HOURS PRIOR TO SCHEULED SURGERY. PLEASE FINISH ENSURE DRINK PER SURGEON ORDER 3 HOURS PRIOR TO SCHEDULED SURGERY TIME WHICH NEEDS TO BE COMPLETED AT 530 AM.    CLEAR LIQUID DIET   Foods Allowed                                                                     Foods Excluded  Coffee and tea, regular and decaf                             liquids that you cannot  Plain Jell-O in any flavor                                             see through such as: Fruit ices (not with fruit pulp)                                     milk, soups, orange juice  Iced Popsicles                                    All solid food Carbonated beverages, regular and diet                                    Cranberry, grape and apple juices Sports drinks like Gatorade Lightly seasoned clear broth or consume(fat free) Sugar, honey syrup  Sample Menu Breakfast                                Lunch                                     Supper Cranberry juice                    Beef broth                            Chicken broth Jell-O                                     Grape juice  Apple juice Coffee or tea                        Jell-O                                      Popsicle                                                Coffee or tea                        Coffee or tea  _____________________________________________________________________    Take these medicines the morning of surgery with A SIP OF WATER: PANTAPRAZOLE, BUSPIRONE              You may not have any metal on your body including  hair pins and              piercings  Do not wear jewelry, make-up, lotions, powders or perfumes, deodorant             Do not wear nail polish.  Do not shave  48 hours prior to surgery.              Men may shave face and neck.   Do not bring valuables to the hospital. [facility] IS NOT             RESPONSIBLE   FOR VALUABLES.  Contacts, dentures or bridgework may not be worn into surgery.  Leave suitcase in the car. After surgery it may be brought to your room.     Patients discharged the day of surgery will not be allowed to drive home.  Name and phone number of your driver:  Special Instructions: N/A              Please read over the following fact sheets you were given: _____________________________________________________________________  [facility] - Preparing for Surgery Before surgery, you can play an important role.  Because skin is not sterile, your skin needs to be as free of germs as possible.  You can reduce the number of germs on your skin by washing with CHG (chlorahexidine gluconate) soap before surgery.  CHG is an antiseptic cleaner which kills germs and bonds with the skin to continue killing germs even after washing. Please DO NOT use if you have an allergy to CHG or antibacterial soaps.  If your skin becomes reddened/irritated stop using the CHG and inform your nurse when you arrive at Short Stay. Do not shave (including legs and underarms) for at least 48 hours prior to the first CHG shower.  You may shave your face/neck. Please follow these instructions carefully:  1.  Shower with CHG Soap the night before surgery and the  morning of Surgery.  2.  If you choose to wash your hair, wash your hair first as usual with your  normal  shampoo.  3.  After you shampoo, rinse your hair and body thoroughly to remove the  shampoo.                           4.  Use CHG as you would any other liquid soap.  You  can apply chg directly  to the skin and wash                        Gently with a scrungie or clean washcloth.  5.  Apply the CHG Soap to your body ONLY FROM THE NECK DOWN.   Do not use on face/ open                           Wound or open sores. Avoid contact with eyes, ears mouth and genitals (private parts).                       Wash face,  Genitals (private parts) with your normal soap.             6.  Wash thoroughly, paying special attention to the area where your surgery  will be performed.  7.  Thoroughly rinse your body with warm water from the neck down.  8.  DO NOT shower/wash with your normal soap after using and rinsing off  the CHG Soap.                9.  Pat yourself dry with a clean towel.            10.  Wear clean pajamas.            11.  Place clean sheets on your bed the night of your first shower and do not  sleep with pets. Day of Surgery : Do not apply any lotions/deodorants the morning of surgery.  Please wear clean clothes to the hospital/surgery center.  FAILURE TO FOLLOW THESE INSTRUCTIONS MAY RESULT IN THE CANCELLATION OF YOUR SURGERY PATIENT SIGNATURE_________________________________  NURSE SIGNATURE__________________________________  ________________________________________________________________________

## 2018-07-03 ENCOUNTER — Other Ambulatory Visit: Payer: Self-pay

## 2018-07-03 ENCOUNTER — Encounter (HOSPITAL_COMMUNITY): Payer: Self-pay

## 2018-07-03 ENCOUNTER — Encounter (HOSPITAL_COMMUNITY)
Admission: RE | Admit: 2018-07-03 | Discharge: 2018-07-03 | Disposition: A | Discharge status: Home / Self Care | Payer: 59 | Source: Ambulatory Visit | Attending: Surgery | Admitting: Surgery

## 2018-07-03 DIAGNOSIS — K811 Chronic cholecystitis: Secondary | ICD-10-CM | POA: Diagnosis not present

## 2018-07-03 DIAGNOSIS — Z01818 Encounter for other preprocedural examination: Secondary | ICD-10-CM | POA: Diagnosis present

## 2018-07-03 DIAGNOSIS — I10 Essential (primary) hypertension: Secondary | ICD-10-CM | POA: Diagnosis not present

## 2018-07-03 HISTORY — DX: Nausea with vomiting, unspecified: R11.2

## 2018-07-03 HISTORY — DX: Chronic cholecystitis: K81.1

## 2018-07-03 HISTORY — DX: Anxiety disorder, unspecified: F41.9

## 2018-07-03 LAB — BASIC METABOLIC PANEL
Anion gap: 6 (ref 5–15)
BUN: 11 mg/dL (ref 6–20)
CALCIUM: 8.7 mg/dL — AB (ref 8.9–10.3)
CO2: 29 mmol/L (ref 22–32)
CREATININE: 0.6 mg/dL (ref 0.44–1.00)
Chloride: 107 mmol/L (ref 98–111)
GFR calc non Af Amer: >60 mL/min (ref >60)
Glucose, Bld: 78 mg/dL (ref 70–99)
Potassium: 3.8 mmol/L (ref 3.5–5.1)
SODIUM: 142 mmol/L (ref 135–145)

## 2018-07-03 LAB — CBC
HCT: 37.7 % (ref 36.0–46.0)
Hemoglobin: 12.1 g/dL (ref 12.0–15.0)
MCH: 29.4 pg (ref 26.0–34.0)
MCHC: 32.1 g/dL (ref 30.0–36.0)
MCV: 91.7 fL (ref 78.0–100.0)
Platelets: 314 10*3/uL (ref 150–400)
RBC: 4.11 MIL/uL (ref 3.87–5.11)
RDW: 15.1 % (ref 11.5–15.5)
WBC: 5.8 10*3/uL (ref 4.0–10.5)

## 2018-07-03 LAB — HCG, SERUM, QUALITATIVE: PREG SERUM: NEGATIVE

## 2018-07-09 MED ORDER — DEXTROSE 5 % IV SOLN
3.0000 g | INTRAVENOUS | Status: AC
  Administered 2018-07-10: 3 g via INTRAVENOUS
  Filled 2018-07-09: qty 3

## 2018-07-09 MED ORDER — BUPIVACAINE LIPOSOME 1.3 % IJ SUSP
20.0000 mL | Freq: Once | INTRAMUSCULAR | Status: DC
  Filled 2018-07-09: qty 20

## 2018-07-09 NOTE — H&P (Signed)
  Cathy Yang Documented: 05/23/2018 10:41 AM Location: [place] Office Patient #: [id number] DOB: 10/07/1986 Single / Language: [place] / Race: Black or African American Female   History of Present Illness Cathy Yang(Caysie Minnifield B. Daphine DeutscherMartin MD; 05/23/2018 11:18 AM) The patient is a 32 year old female who presents for evaluation of gall stones. she is symptomatic with nausea vomiting and right upper quadrant abdominal pain intermittently. She had a sleeve gastrectomy 7 months ago. Recent ultrasound showed multiple gallstones. I think she likely has symptomatic: Lithiasis cholecystitis. We discussed left upper cholecystectomy we will go in schedule. I have produced most of her documentation in Epic including a history and physical. Orders written. Plan laparoscopic cholecystectomy   Allergies Doristine Devoid(Chemira Jones, CMA; 05/23/2018 10:44 AM) NSAIDs  No Known Allergies [04/18/2017]: (Marked as Inactive)  Medication History (Chemira Jones, CMA; 05/23/2018 10:44 AM) Pantoprazole Sodium (40MG  Tablet DR, 1 (one) Oral daily, Taken starting 09/27/2017) Active. Ondansetron (4MG  Tablet Disint, 1 (one) Oral every six hours, as needed, Taken starting 04/11/2018) Active. Carafate (1GM/10ML Suspension, 10 Oral take as directed, Taken starting 04/11/2018) Active. (Take one dose with meals and at bedtime.) Vitamin D (Ergocalciferol) (50000UNIT Capsule, Oral) Active. BusPIRone HCl (7.5MG  Tablet, Oral) Active. FLUoxetine HCl (20MG  Capsule, Oral) Active. Spironolactone (25MG  Tablet, Oral) Active. MiraLax (Oral) Active. TraMADol HCl (50MG  Tablet, Oral) Active. Celebrate Calcium Plus 500 (500-333MG -UNIT Tablet Chewable, Oral) Active. Tums (500MG  Tablet Chewable, Oral) Active. Medications Reconciled  Vitals (Chemira Jones CMA; 05/23/2018 10:41 AM) 05/23/2018 10:41 AM Weight: 271.2 lb Height: 65in Body Surface Area: 2.25 m Body Mass Index: 45.13 kg/m  BP: 134/84 (Sitting, Left  Arm, Standard) Heent unremarkable Chest clear Heart SR      Physical Exam (Julia Alkhatib B. Daphine DeutscherMartin MD; 05/23/2018 11:18 AM) General Note: physical exam was updated from her previous surgery. She has lost 70 pounds. She has no DVT or issues other than the recurrent nausea vomiting and gallstones.     Assessment & Plan Cathy Yang(Tylisha Danis B. Daphine DeutscherMartin MD; 05/23/2018 11:19 AM) CHRONIC CHOLECYSTITIS WITH CALCULUS (K80.10) Impression: plan laparoscopic cholecystectomy with intraoperative cholangiogram.

## 2018-07-09 NOTE — Anesthesia Preprocedure Evaluation (Addendum)
Anesthesia Evaluation  Patient identified by MRN, date of birth, ID band Patient awake    Reviewed: Allergy & Precautions, NPO status , Patient's Chart, lab work & pertinent test results  History of Anesthesia Complications Negative for: history of anesthetic complications  Airway Mallampati: II  TM Distance: >3 FB Neck ROM: Full    Dental  (+) Dental Advisory Given   Pulmonary neg pulmonary ROS,    breath sounds clear to auscultation       Cardiovascular hypertension, (-) angina Rhythm:Regular Rate:Normal     Neuro/Psych negative neurological ROS     GI/Hepatic Neg liver ROS, GERD  Medicated and Controlled,S/p gastric sleeve   Endo/Other  Morbid obesity  Renal/GU negative Renal ROS     Musculoskeletal   Abdominal (+) + obese,   Peds  Hematology negative hematology ROS (+)   Anesthesia Other Findings   Reproductive/Obstetrics 07/03/18 preg test NEG                            Anesthesia Physical Anesthesia Plan  ASA: III  Anesthesia Plan: General   Post-op Pain Management:    Induction: Intravenous  PONV Risk Score and Plan: 4 or greater and Scopolamine patch - Pre-op, Dexamethasone and Ondansetron  Airway Management Planned: Oral ETT  Additional Equipment:   Intra-op Plan:   Post-operative Plan: Extubation in OR  Informed Consent: I have reviewed the patients History and Physical, chart, labs and discussed the procedure including the risks, benefits and alternatives for the proposed anesthesia with the patient or authorized representative who has indicated his/her understanding and acceptance.   Dental advisory given  Plan Discussed with: CRNA and Surgeon  Anesthesia Plan Comments: (Plan routine monitors, GETA)        Anesthesia Quick Evaluation

## 2018-07-10 ENCOUNTER — Encounter (HOSPITAL_COMMUNITY): Payer: Self-pay | Admitting: Emergency Medicine

## 2018-07-10 ENCOUNTER — Encounter (HOSPITAL_COMMUNITY)
Admission: RE | Disposition: A | Discharge status: Home / Self Care | Payer: Self-pay | Source: Ambulatory Visit | Attending: Surgery

## 2018-07-10 ENCOUNTER — Other Ambulatory Visit: Payer: Self-pay

## 2018-07-10 ENCOUNTER — Ambulatory Visit (HOSPITAL_COMMUNITY): Payer: 59 | Admitting: Anesthesiology

## 2018-07-10 ENCOUNTER — Ambulatory Visit (HOSPITAL_COMMUNITY): Payer: 59

## 2018-07-10 ENCOUNTER — Observation Stay (HOSPITAL_COMMUNITY)
Admission: RE | Admit: 2018-07-10 | Discharge: 2018-07-11 | Disposition: A | Discharge status: Home / Self Care | Payer: 59 | Source: Ambulatory Visit | Attending: Surgery | Admitting: Surgery

## 2018-07-10 DIAGNOSIS — K219 Gastro-esophageal reflux disease without esophagitis: Secondary | ICD-10-CM | POA: Insufficient documentation

## 2018-07-10 DIAGNOSIS — Z9049 Acquired absence of other specified parts of digestive tract: Secondary | ICD-10-CM

## 2018-07-10 DIAGNOSIS — Z6841 Body Mass Index (BMI) 40.0 and over, adult: Secondary | ICD-10-CM | POA: Insufficient documentation

## 2018-07-10 DIAGNOSIS — Z419 Encounter for procedure for purposes other than remedying health state, unspecified: Secondary | ICD-10-CM

## 2018-07-10 DIAGNOSIS — Z79899 Other long term (current) drug therapy: Secondary | ICD-10-CM | POA: Insufficient documentation

## 2018-07-10 DIAGNOSIS — K8010 Calculus of gallbladder with chronic cholecystitis without obstruction: Principal | ICD-10-CM | POA: Insufficient documentation

## 2018-07-10 HISTORY — PX: CHOLECYSTECTOMY: SHX55

## 2018-07-10 LAB — CREATININE, SERUM
Creatinine, Ser: 0.7 mg/dL (ref 0.44–1.00)
GFR calc non Af Amer: >60 mL/min (ref >60)

## 2018-07-10 LAB — CBC
HEMATOCRIT: 40.3 % (ref 36.0–46.0)
HEMOGLOBIN: 13.1 g/dL (ref 12.0–15.0)
MCH: 29.4 pg (ref 26.0–34.0)
MCHC: 32.5 g/dL (ref 30.0–36.0)
MCV: 90.6 fL (ref 78.0–100.0)
Platelets: 270 10*3/uL (ref 150–400)
RBC: 4.45 MIL/uL (ref 3.87–5.11)
RDW: 14.8 % (ref 11.5–15.5)
WBC: 7.8 10*3/uL (ref 4.0–10.5)

## 2018-07-10 SURGERY — LAPAROSCOPIC CHOLECYSTECTOMY WITH INTRAOPERATIVE CHOLANGIOGRAM
Anesthesia: General

## 2018-07-10 MED ORDER — EPHEDRINE 5 MG/ML INJ
INTRAVENOUS | Status: AC
  Filled 2018-07-10: qty 10

## 2018-07-10 MED ORDER — HEPARIN SODIUM (PORCINE) 5000 UNIT/ML IJ SOLN
5000.0000 [IU] | Freq: Once | INTRAMUSCULAR | Status: AC
  Administered 2018-07-10: 5000 [IU] via SUBCUTANEOUS
  Filled 2018-07-10: qty 1

## 2018-07-10 MED ORDER — 0.9 % SODIUM CHLORIDE (POUR BTL) OPTIME
TOPICAL | Status: DC | PRN
  Administered 2018-07-10: 1000 mL

## 2018-07-10 MED ORDER — HEPARIN SODIUM (PORCINE) 5000 UNIT/ML IJ SOLN
5000.0000 [IU] | Freq: Three times a day (TID) | INTRAMUSCULAR | Status: DC
  Administered 2018-07-10 – 2018-07-11 (×2): 5000 [IU] via SUBCUTANEOUS
  Filled 2018-07-10 (×2): qty 1

## 2018-07-10 MED ORDER — LIDOCAINE 2% (20 MG/ML) 5 ML SYRINGE
INTRAMUSCULAR | Status: DC | PRN
  Administered 2018-07-10: 75 mg via INTRAVENOUS

## 2018-07-10 MED ORDER — HYDROMORPHONE HCL 1 MG/ML IJ SOLN
INTRAMUSCULAR | Status: AC
  Filled 2018-07-10: qty 1

## 2018-07-10 MED ORDER — GABAPENTIN 300 MG PO CAPS
300.0000 mg | ORAL_CAPSULE | Freq: Two times a day (BID) | ORAL | Status: DC
  Administered 2018-07-10 – 2018-07-11 (×2): 300 mg via ORAL
  Filled 2018-07-10 (×2): qty 1

## 2018-07-10 MED ORDER — KCL IN DEXTROSE-NACL 20-5-0.45 MEQ/L-%-% IV SOLN
INTRAVENOUS | Status: DC
  Administered 2018-07-10: 12:00:00 via INTRAVENOUS
  Filled 2018-07-10: qty 1000

## 2018-07-10 MED ORDER — FENTANYL CITRATE (PF) 250 MCG/5ML IJ SOLN
INTRAMUSCULAR | Status: AC
  Filled 2018-07-10: qty 5

## 2018-07-10 MED ORDER — LIDOCAINE 2% (20 MG/ML) 5 ML SYRINGE
INTRAMUSCULAR | Status: AC
  Filled 2018-07-10: qty 10

## 2018-07-10 MED ORDER — BUPIVACAINE LIPOSOME 1.3 % IJ SUSP
INTRAMUSCULAR | Status: DC | PRN
  Administered 2018-07-10: 20 mL

## 2018-07-10 MED ORDER — PROPOFOL 10 MG/ML IV BOLUS
INTRAVENOUS | Status: DC | PRN
  Administered 2018-07-10: 200 mg via INTRAVENOUS

## 2018-07-10 MED ORDER — TRAMADOL HCL 50 MG PO TABS
50.0000 mg | ORAL_TABLET | Freq: Four times a day (QID) | ORAL | Status: DC | PRN
  Administered 2018-07-11: 50 mg via ORAL
  Filled 2018-07-10: qty 1

## 2018-07-10 MED ORDER — CEFAZOLIN SODIUM-DEXTROSE 2-4 GM/100ML-% IV SOLN
2.0000 g | Freq: Three times a day (TID) | INTRAVENOUS | Status: AC
  Administered 2018-07-10: 2 g via INTRAVENOUS
  Filled 2018-07-10: qty 100

## 2018-07-10 MED ORDER — SUGAMMADEX SODIUM 500 MG/5ML IV SOLN
INTRAVENOUS | Status: DC | PRN
  Administered 2018-07-10: 300 mg via INTRAVENOUS

## 2018-07-10 MED ORDER — DEXAMETHASONE SODIUM PHOSPHATE 10 MG/ML IJ SOLN
INTRAMUSCULAR | Status: DC | PRN
  Administered 2018-07-10: 5 mg via INTRAVENOUS

## 2018-07-10 MED ORDER — SCOPOLAMINE 1 MG/3DAYS TD PT72
1.0000 | MEDICATED_PATCH | TRANSDERMAL | Status: DC
  Administered 2018-07-10: 1.5 mg via TRANSDERMAL

## 2018-07-10 MED ORDER — MEPERIDINE HCL 50 MG/ML IJ SOLN: 6.2500 mg | INTRAMUSCULAR | Status: DC | PRN

## 2018-07-10 MED ORDER — LIDOCAINE 20MG/ML (2%) 15 ML SYRINGE OPTIME
INTRAMUSCULAR | Status: DC | PRN
  Administered 2018-07-10: 1.5 mg/kg/h via INTRAVENOUS

## 2018-07-10 MED ORDER — ALBUTEROL SULFATE HFA 108 (90 BASE) MCG/ACT IN AERS
INHALATION_SPRAY | RESPIRATORY_TRACT | Status: AC
  Filled 2018-07-10: qty 6.7

## 2018-07-10 MED ORDER — HYDROCODONE-ACETAMINOPHEN 5-325 MG PO TABS
1.0000 | ORAL_TABLET | ORAL | Status: DC | PRN
  Administered 2018-07-10 – 2018-07-11 (×3): 1 via ORAL
  Filled 2018-07-10 (×3): qty 1

## 2018-07-10 MED ORDER — SUCCINYLCHOLINE CHLORIDE 200 MG/10ML IV SOSY
PREFILLED_SYRINGE | INTRAVENOUS | Status: AC
  Filled 2018-07-10: qty 10

## 2018-07-10 MED ORDER — LIDOCAINE HCL 2 % IJ SOLN
INTRAMUSCULAR | Status: AC
  Filled 2018-07-10: qty 20

## 2018-07-10 MED ORDER — HYDRALAZINE HCL 20 MG/ML IJ SOLN: 10.0000 mg | INTRAMUSCULAR | Status: DC | PRN

## 2018-07-10 MED ORDER — CHLORHEXIDINE GLUCONATE CLOTH 2 % EX PADS: 6.0000 | MEDICATED_PAD | Freq: Once | CUTANEOUS | Status: DC

## 2018-07-10 MED ORDER — MIDAZOLAM HCL 2 MG/2ML IJ SOLN: 0.5000 mg | Freq: Once | INTRAMUSCULAR | Status: DC | PRN

## 2018-07-10 MED ORDER — IOPAMIDOL (ISOVUE-300) INJECTION 61%
INTRAVENOUS | Status: AC
  Filled 2018-07-10: qty 50

## 2018-07-10 MED ORDER — SPIRONOLACTONE 25 MG PO TABS
25.0000 mg | ORAL_TABLET | Freq: Every day | ORAL | Status: DC
  Administered 2018-07-11: 25 mg via ORAL
  Filled 2018-07-10: qty 1

## 2018-07-10 MED ORDER — PHENYLEPHRINE 40 MCG/ML (10ML) SYRINGE FOR IV PUSH (FOR BLOOD PRESSURE SUPPORT)
PREFILLED_SYRINGE | INTRAVENOUS | Status: AC
  Filled 2018-07-10: qty 10

## 2018-07-10 MED ORDER — IOPAMIDOL (ISOVUE-300) INJECTION 61%
INTRAVENOUS | Status: DC | PRN
  Administered 2018-07-10: 5 mL

## 2018-07-10 MED ORDER — MIDAZOLAM HCL 5 MG/5ML IJ SOLN
INTRAMUSCULAR | Status: DC | PRN
  Administered 2018-07-10: 2 mg via INTRAVENOUS

## 2018-07-10 MED ORDER — SCOPOLAMINE 1 MG/3DAYS TD PT72
MEDICATED_PATCH | TRANSDERMAL | Status: AC
  Administered 2018-07-10: 1.5 mg via TRANSDERMAL
  Filled 2018-07-10: qty 1

## 2018-07-10 MED ORDER — MORPHINE SULFATE (PF) 2 MG/ML IV SOLN
1.0000 mg | INTRAVENOUS | Status: DC | PRN
  Administered 2018-07-10 (×3): 1 mg via INTRAVENOUS
  Filled 2018-07-10 (×3): qty 1

## 2018-07-10 MED ORDER — LACTATED RINGERS IR SOLN
Status: DC | PRN
  Administered 2018-07-10: 1000 mL

## 2018-07-10 MED ORDER — SUGAMMADEX SODIUM 500 MG/5ML IV SOLN
INTRAVENOUS | Status: AC
  Filled 2018-07-10: qty 5

## 2018-07-10 MED ORDER — ROCURONIUM BROMIDE 10 MG/ML (PF) SYRINGE
PREFILLED_SYRINGE | INTRAVENOUS | Status: DC | PRN
  Administered 2018-07-10: 50 mg via INTRAVENOUS
  Administered 2018-07-10: 20 mg via INTRAVENOUS

## 2018-07-10 MED ORDER — ROCURONIUM BROMIDE 10 MG/ML (PF) SYRINGE
PREFILLED_SYRINGE | INTRAVENOUS | Status: AC
  Filled 2018-07-10: qty 10

## 2018-07-10 MED ORDER — KETAMINE HCL 10 MG/ML IJ SOLN
INTRAMUSCULAR | Status: DC | PRN
  Administered 2018-07-10: 25 mg via INTRAVENOUS

## 2018-07-10 MED ORDER — MIDAZOLAM HCL 2 MG/2ML IJ SOLN
INTRAMUSCULAR | Status: AC
  Filled 2018-07-10: qty 2

## 2018-07-10 MED ORDER — PROMETHAZINE HCL 25 MG/ML IJ SOLN: 6.2500 mg | INTRAMUSCULAR | Status: DC | PRN

## 2018-07-10 MED ORDER — GABAPENTIN 300 MG PO CAPS
300.0000 mg | ORAL_CAPSULE | ORAL | Status: AC
  Administered 2018-07-10: 300 mg via ORAL
  Filled 2018-07-10: qty 1

## 2018-07-10 MED ORDER — FENTANYL CITRATE (PF) 250 MCG/5ML IJ SOLN
INTRAMUSCULAR | Status: DC | PRN
  Administered 2018-07-10: 50 ug via INTRAVENOUS
  Administered 2018-07-10 (×2): 100 ug via INTRAVENOUS

## 2018-07-10 MED ORDER — ACETAMINOPHEN 500 MG PO TABS
1000.0000 mg | ORAL_TABLET | ORAL | Status: AC
  Administered 2018-07-10: 1000 mg via ORAL
  Filled 2018-07-10: qty 2

## 2018-07-10 MED ORDER — ONDANSETRON 4 MG PO TBDP: 4.0000 mg | ORAL_TABLET | Freq: Four times a day (QID) | ORAL | Status: DC | PRN

## 2018-07-10 MED ORDER — PROPOFOL 10 MG/ML IV BOLUS
INTRAVENOUS | Status: AC
  Filled 2018-07-10: qty 20

## 2018-07-10 MED ORDER — HYDROMORPHONE HCL 1 MG/ML IJ SOLN
0.2500 mg | INTRAMUSCULAR | Status: DC | PRN
  Administered 2018-07-10 (×3): 0.25 mg via INTRAVENOUS
  Administered 2018-07-10 (×2): 0.5 mg via INTRAVENOUS

## 2018-07-10 MED ORDER — CELECOXIB 200 MG PO CAPS: 200.0000 mg | ORAL_CAPSULE | ORAL | Status: DC

## 2018-07-10 MED ORDER — FAMOTIDINE IN NACL 20-0.9 MG/50ML-% IV SOLN
20.0000 mg | Freq: Two times a day (BID) | INTRAVENOUS | Status: DC
  Administered 2018-07-10: 20 mg via INTRAVENOUS
  Filled 2018-07-10: qty 50

## 2018-07-10 MED ORDER — ONDANSETRON HCL 4 MG/2ML IJ SOLN: 4.0000 mg | Freq: Four times a day (QID) | INTRAMUSCULAR | Status: DC | PRN

## 2018-07-10 MED ORDER — KETAMINE HCL 10 MG/ML IJ SOLN
INTRAMUSCULAR | Status: AC
  Filled 2018-07-10: qty 1

## 2018-07-10 MED ORDER — LACTATED RINGERS IV SOLN
INTRAVENOUS | Status: DC
  Administered 2018-07-10: 08:00:00 via INTRAVENOUS

## 2018-07-10 MED ORDER — ONDANSETRON HCL 4 MG/2ML IJ SOLN
INTRAMUSCULAR | Status: DC | PRN
  Administered 2018-07-10: 4 mg via INTRAVENOUS

## 2018-07-10 SURGICAL SUPPLY — 40 items
APPLICATOR COTTON TIP 6 STRL (MISCELLANEOUS) ×2 IMPLANT
APPLICATOR COTTON TIP 6IN STRL (MISCELLANEOUS) ×6
APPLIER CLIP ROT 10 11.4 M/L (STAPLE) ×3
BENZOIN TINCTURE PRP APPL 2/3 (GAUZE/BANDAGES/DRESSINGS) IMPLANT
CABLE HIGH FREQUENCY MONO STRZ (ELECTRODE) ×3 IMPLANT
CATH REDDICK CHOLANGI 4FR 50CM (CATHETERS) ×3 IMPLANT
CLIP APPLIE ROT 10 11.4 M/L (STAPLE) ×1 IMPLANT
CLOSURE WOUND 1/2 X4 (GAUZE/BANDAGES/DRESSINGS)
COVER MAYO STAND STRL (DRAPES) ×3 IMPLANT
COVER SURGICAL LIGHT HANDLE (MISCELLANEOUS) ×3 IMPLANT
DECANTER SPIKE VIAL GLASS SM (MISCELLANEOUS) ×3 IMPLANT
DERMABOND ADVANCED (GAUZE/BANDAGES/DRESSINGS) ×2
DERMABOND ADVANCED .7 DNX12 (GAUZE/BANDAGES/DRESSINGS) ×1 IMPLANT
DRAPE C-ARM 42X120 X-RAY (DRAPES) ×3 IMPLANT
ELECT PENCIL ROCKER SW 15FT (MISCELLANEOUS) IMPLANT
ELECT REM PT RETURN 15FT ADLT (MISCELLANEOUS) ×3 IMPLANT
GLOVE BIOGEL M 8.0 STRL (GLOVE) ×3 IMPLANT
GLOVE BIOGEL PI IND STRL 7.0 (GLOVE) ×2 IMPLANT
GLOVE BIOGEL PI INDICATOR 7.0 (GLOVE) ×4
GLOVE SURG SS PI 7.0 STRL IVOR (GLOVE) ×6 IMPLANT
GOWN STRL REUS W/TWL XL LVL3 (GOWN DISPOSABLE) ×9 IMPLANT
HEMOSTAT SURGICEL 4X8 (HEMOSTASIS) IMPLANT
IV CATH 14GX2 1/4 (CATHETERS) ×3 IMPLANT
KIT BASIN OR (CUSTOM PROCEDURE TRAY) ×3 IMPLANT
L-HOOK LAP DISP 36CM (ELECTROSURGICAL) ×3
LHOOK LAP DISP 36CM (ELECTROSURGICAL) ×1 IMPLANT
POUCH RETRIEVAL ECOSAC 10 (ENDOMECHANICALS) ×1 IMPLANT
POUCH RETRIEVAL ECOSAC 10MM (ENDOMECHANICALS) ×2
SCISSORS LAP 5X45 EPIX DISP (ENDOMECHANICALS) ×3 IMPLANT
SET IRRIG TUBING LAPAROSCOPIC (IRRIGATION / IRRIGATOR) ×3 IMPLANT
SLEEVE XCEL OPT CAN 5 100 (ENDOMECHANICALS) ×3 IMPLANT
STRIP CLOSURE SKIN 1/2X4 (GAUZE/BANDAGES/DRESSINGS) IMPLANT
SUT MNCRL AB 4-0 PS2 18 (SUTURE) ×6 IMPLANT
SYR 20CC LL (SYRINGE) ×3 IMPLANT
TOWEL OR 17X26 10 PK STRL BLUE (TOWEL DISPOSABLE) ×3 IMPLANT
TRAY LAPAROSCOPIC (CUSTOM PROCEDURE TRAY) ×3 IMPLANT
TROCAR BLADELESS OPT 5 100 (ENDOMECHANICALS) ×3 IMPLANT
TROCAR XCEL BLUNT TIP 100MML (ENDOMECHANICALS) IMPLANT
TROCAR XCEL NON-BLD 11X100MML (ENDOMECHANICALS) ×3 IMPLANT
TUBING INSUF HEATED (TUBING) ×3 IMPLANT

## 2018-07-10 NOTE — Transfer of Care (Signed)
Immediate Anesthesia Transfer of Care Note  Patient: Cathy Yang  Procedure(s) Performed: LAPAROSCOPIC CHOLECYSTECTOMY WITH INTRAOPERATIVE CHOLANGIOGRAM (N/A )  Patient Location: PACU  Anesthesia Type:General  Level of Consciousness: awake, drowsy and patient cooperative  Airway & Oxygen Therapy: Patient Spontanous Breathing and Patient connected to face mask oxygen  Post-op Assessment: Report given to RN and Post -op Vital signs reviewed and stable  Post vital signs: Reviewed and stable  Last Vitals:  Vitals Value Taken Time  BP 144/98 07/10/2018 10:06 AM  Temp    Pulse 101 07/10/2018 10:06 AM  Resp 24 07/10/2018 10:06 AM  SpO2 100 % 07/10/2018 10:06 AM  Vitals shown include unvalidated device data.  Last Pain:  Vitals:   07/10/18 0730  TempSrc:   PainSc: 0-No pain      Patients Stated Pain Goal: 4 (07/10/18 0730)  Complications: No apparent anesthesia complications

## 2018-07-10 NOTE — Anesthesia Postprocedure Evaluation (Signed)
Anesthesia Post Note  Patient: Cathy Yang  Procedure(s) Performed: LAPAROSCOPIC CHOLECYSTECTOMY WITH INTRAOPERATIVE CHOLANGIOGRAM (N/A )     Patient location during evaluation: PACU Anesthesia Type: General Level of consciousness: awake and alert, oriented and patient cooperative Pain management: pain level controlled Vital Signs Assessment: post-procedure vital signs reviewed and stable Respiratory status: spontaneous breathing, nonlabored ventilation and respiratory function stable Cardiovascular status: blood pressure returned to baseline and stable Postop Assessment: no apparent nausea or vomiting Anesthetic complications: no    Last Vitals:  Vitals:   07/10/18 1139 07/10/18 1251  BP: (!) 143/101 (!) 136/92  Pulse: 78 74  Resp: 18 18  Temp: 36.7 C 36.7 C  SpO2: 100% 100%    Last Pain:  Vitals:   07/10/18 1251  TempSrc: Oral  PainSc:                  Karsten Howry,E. Ervan Heber

## 2018-07-10 NOTE — Interval H&P Note (Signed)
History and Physical Interval Note:  07/10/2018 7:58 AM  Cathy Yang  has presented today for surgery, with the diagnosis of LAP CHOLE  The various methods of treatment have been discussed with the patient and family. After consideration of risks, benefits and other options for treatment, the patient has consented to  Procedure(s): LAPAROSCOPIC CHOLECYSTECTOMY WITH INTRAOPERATIVE CHOLANGIOGRAM (N/A) as a surgical intervention .  The patient's history has been reviewed, patient examined, no change in status, stable for surgery.  I have reviewed the patient's chart and labs.  Questions were answered to the patient's satisfaction.     Valarie MerinoMatthew B Auna Mikkelsen

## 2018-07-10 NOTE — Op Note (Signed)
Anne Fuanvis Isabell  Primary Care Physician:  Marlinda MikeBailey, Tanya, CNM    07/10/2018  10:02 AM  Procedure: Laparoscopic Cholecystectomy with intraoperative cholangiogram  Surgeon: Susy FrizzleMatt B. Daphine DeutscherMartin, MD, FACS Asst:  none  Anes:  General  Drains:  None  Findings: Chronic cholecystitis with beading of the intrahepatic ducts on IOC  Description of Procedure: The patient was taken to OR 1 and given general anesthesia.  The patient was prepped with PCMX and draped sterilely. A time out was performed.  Access to the abdomen was achieved with a 5 mm Optiview through the right upper quadrant.  Port placement included three of the 5 mm trocars and one 11 mm trocar.  The sleeve was visualized and looked OK.  There was an adhesive band to the sleeve extraction port and this was relieved.    The gallbladder was visualized and the fundus was grasped and the gallbladder was elevated. Traction on the infundibulum allowed for successful demonstration of the critical view. Inflammatory changes were chronic.  The cystic duct was identified and clipped up on the gallbladder and an incision was made in the cystic duct and the Reddick catheter was inserted after milking the cystic duct of any debris. A dynamic cholangiogram was performed which demonstrated small bile ducts and dilated areas and areas that may be differently.    The cystic duct was then triple clipped and divided, the cystic artery was double clipped and divided and then the gallbladder was removed from the gallbladder bed. Removal of the gallbladder from the gallbladder bed was performed without entering it.  The gallbladder was then placed in a bag and brought out through one of the trocar sites. The gallbladder bed was inspected and no bleeding or bile leaks were seen.   Incisions were injected with Exparel and closed with 4-0 Monocryl and Dermabond on the skin.  Sponge and needle count were correct.    The patient was taken to the recovery room in  satisfactory condition.

## 2018-07-10 NOTE — Anesthesia Procedure Notes (Signed)
Procedure Name: Intubation Date/Time: 07/10/2018 8:42 AM Performed by: Elyn PeersAllen, Saadiya Wilfong J, CRNA Pre-anesthesia Checklist: Patient identified, Emergency Drugs available, Suction available, Patient being monitored and Timeout performed Patient Re-evaluated:Patient Re-evaluated prior to induction Oxygen Delivery Method: Circle system utilized Preoxygenation: Pre-oxygenation with 100% oxygen Induction Type: IV induction Ventilation: Mask ventilation without difficulty and Oral airway inserted - appropriate to patient size Laryngoscope Size: Miller and 3 Grade View: Grade I Tube type: Oral Tube size: 7.5 mm Number of attempts: 1 Airway Equipment and Method: Stylet Placement Confirmation: ETT inserted through vocal cords under direct vision,  positive ETCO2 and breath sounds checked- equal and bilateral Secured at: 22 cm Tube secured with: Tape Dental Injury: Teeth and Oropharynx as per pre-operative assessment

## 2018-07-11 ENCOUNTER — Encounter (HOSPITAL_COMMUNITY): Payer: Self-pay | Admitting: Surgery

## 2018-07-11 DIAGNOSIS — K8010 Calculus of gallbladder with chronic cholecystitis without obstruction: Secondary | ICD-10-CM | POA: Diagnosis not present

## 2018-07-11 LAB — COMPREHENSIVE METABOLIC PANEL
ALBUMIN: 3.2 g/dL — AB (ref 3.5–5.0)
ALT: 22 U/L (ref 0–44)
ANION GAP: 9 (ref 5–15)
AST: 24 U/L (ref 15–41)
Alkaline Phosphatase: 68 U/L (ref 38–126)
BUN: 6 mg/dL (ref 6–20)
CO2: 26 mmol/L (ref 22–32)
Calcium: 9 mg/dL (ref 8.9–10.3)
Chloride: 105 mmol/L (ref 98–111)
Creatinine, Ser: 0.61 mg/dL (ref 0.44–1.00)
GFR calc Af Amer: >60 mL/min (ref >60)
GFR calc non Af Amer: >60 mL/min (ref >60)
GLUCOSE: 95 mg/dL (ref 70–99)
POTASSIUM: 3.9 mmol/L (ref 3.5–5.1)
Sodium: 140 mmol/L (ref 135–145)
Total Bilirubin: 0.7 mg/dL (ref 0.3–1.2)
Total Protein: 6.6 g/dL (ref 6.5–8.1)

## 2018-07-11 LAB — CBC
HCT: 40.1 % (ref 36.0–46.0)
HEMOGLOBIN: 12.9 g/dL (ref 12.0–15.0)
MCH: 29.2 pg (ref 26.0–34.0)
MCHC: 32.2 g/dL (ref 30.0–36.0)
MCV: 90.7 fL (ref 78.0–100.0)
Platelets: 298 10*3/uL (ref 150–400)
RBC: 4.42 MIL/uL (ref 3.87–5.11)
RDW: 14.7 % (ref 11.5–15.5)
WBC: 8.5 10*3/uL (ref 4.0–10.5)

## 2018-07-11 MED ORDER — HYDROCODONE-ACETAMINOPHEN 5-325 MG PO TABS: 1.0000 | ORAL_TABLET | ORAL | 0 refills | Status: DC | PRN

## 2018-07-11 NOTE — Discharge Instructions (Signed)

## 2018-07-11 NOTE — Care Management Note (Signed)
Case Management Note  Patient Details  Name: Anne FuCanvis Corbridge MRN: [id number] Date of Birth: 02/20/1986  Subjective/Objective:  S/p lap chole. From home. No CM needs.                  Action/Plan:d/c home.   Expected Discharge Date:  07/11/18               Expected Discharge Plan:  Home/Self Care  In-House Referral:     Discharge planning Services  CM Consult  Post Acute Care Choice:    Choice offered to:     DME Arranged:    DME Agency:     HH Arranged:    HH Agency:     Status of Service:  Completed, signed off  If discussed at [employer] Length of Stay Meetings, dates discussed:    Additional Comments:  Lanier ClamMahabir, Amadeo Coke, RN 07/11/2018, 10:28 AM

## 2018-07-11 NOTE — Discharge Summary (Signed)
.   Physician Discharge Summary  Patient ID: Cathy Yang MRN: [id number] DOB/AGE: 31/06/1986 31 y.o.  PCP: Marlinda MikeBailey, Tanya, CNM  Admit date: 07/10/2018 Discharge date: 07/11/2018  Admission Diagnoses:  Chronic cholecystitis  Discharge Diagnoses:  same  Principal Problem:   S/P laparoscopic cholecystectomy Sept 2019   Surgery:  Laparoscopic cholecystectomy and enterolysis of one adhesive band  Discharged Condition: improved  Hospital Course:   Had lap chole and kept overnight and ready for discharge on PD 1  Consults: none  Significant Diagnostic Studies: none    Discharge Exam: Blood pressure 132/78, pulse 74, temperature (!) 97.4 F (36.3 C), temperature source Oral, resp. rate 18, height 5\' 5"  (1.651 m), weight 124.7 kg, last menstrual period 06/01/2018, SpO2 100 %. Incisions ok   Disposition: Discharge disposition: 01-Home or Self Care       Discharge Instructions    Discharge instructions   Complete by:  As directed    Follow your bariatric diet   Increase activity slowly   Complete by:  As directed      Allergies as of 07/11/2018      Reactions   Nsaids Other (See Comments)   Unable to take due to gastric sleeve      Medication List    TAKE these medications   acetaminophen 500 MG tablet Commonly known as:  TYLENOL Take 1,000 mg by mouth every 6 (six) hours as needed for moderate pain or headache.   busPIRone 7.5 MG tablet Commonly known as:  BUSPAR Take 7.5 mg by mouth 2 (two) times daily.   calcium carbonate 500 MG chewable tablet Commonly known as:  TUMS - dosed in mg elemental calcium Chew 1 tablet by mouth 2 (two) times daily.   CELEBRATE MULTI-COMPLETE 18 PO Take 1 tablet by mouth 2 (two) times daily.   HYDROcodone-acetaminophen 5-325 MG tablet Commonly known as:  NORCO/VICODIN Take 1-2 tablets by mouth every 4 (four) hours as needed for moderate pain.   levonorgestrel 20 MCG/24HR IUD Commonly known as:  MIRENA 1 each by  Intrauterine route once.   ondansetron 4 MG disintegrating tablet Commonly known as:  ZOFRAN-ODT Take 4 mg by mouth every 8 (eight) hours as needed for nausea or vomiting.   pantoprazole 40 MG tablet Commonly known as:  PROTONIX Take 40 mg by mouth daily before breakfast.   spironolactone 25 MG tablet Commonly known as:  ALDACTONE Take 25 mg by mouth daily.   sucralfate 1 GM/10ML suspension Commonly known as:  CARAFATE Take 1 g by mouth daily.      Follow-up Information    Luretha MurphyMartin, Aayliah Rotenberry, MD. Schedule an appointment as soon as possible for a visit in 3 week(s).   Specialty:  General Surgery Contact information: [address] [place] [address] [phone]           Signed: Valarie MerinoMatthew B Casper Pagliuca 07/11/2018, 8:35 AM

## 2018-07-11 NOTE — Progress Notes (Signed)
Assessment unchanged. Pt verbalized understanding of dc instructions through teach back. No scripts at dc. Discharged via foot per request [facility] and husband to front entrance.

## 2018-12-30 ENCOUNTER — Other Ambulatory Visit: Payer: Self-pay

## 2018-12-30 ENCOUNTER — Emergency Department (HOSPITAL_COMMUNITY)
Admission: EM | Admit: 2018-12-30 | Discharge: 2018-12-31 | Disposition: A | Discharge status: Home / Self Care | Payer: 59 | Attending: Emergency Medicine | Admitting: Emergency Medicine

## 2018-12-30 ENCOUNTER — Encounter (HOSPITAL_COMMUNITY): Payer: Self-pay

## 2018-12-30 DIAGNOSIS — R51 Headache: Secondary | ICD-10-CM | POA: Insufficient documentation

## 2018-12-30 DIAGNOSIS — R197 Diarrhea, unspecified: Secondary | ICD-10-CM | POA: Diagnosis not present

## 2018-12-30 DIAGNOSIS — Z9884 Bariatric surgery status: Secondary | ICD-10-CM | POA: Insufficient documentation

## 2018-12-30 DIAGNOSIS — R1012 Left upper quadrant pain: Secondary | ICD-10-CM | POA: Diagnosis not present

## 2018-12-30 DIAGNOSIS — R112 Nausea with vomiting, unspecified: Secondary | ICD-10-CM | POA: Insufficient documentation

## 2018-12-30 DIAGNOSIS — Z79899 Other long term (current) drug therapy: Secondary | ICD-10-CM | POA: Diagnosis not present

## 2018-12-30 DIAGNOSIS — I10 Essential (primary) hypertension: Secondary | ICD-10-CM | POA: Insufficient documentation

## 2018-12-30 LAB — CBC
HEMATOCRIT: 45.7 % (ref 36.0–46.0)
Hemoglobin: 14.1 g/dL (ref 12.0–15.0)
MCH: 29.5 pg (ref 26.0–34.0)
MCHC: 30.9 g/dL (ref 30.0–36.0)
MCV: 95.6 fL (ref 80.0–100.0)
NRBC: 0 % (ref 0.0–0.2)
Platelets: 263 10*3/uL (ref 150–400)
RBC: 4.78 MIL/uL (ref 3.87–5.11)
RDW: 13.8 % (ref 11.5–15.5)
WBC: 4.9 10*3/uL (ref 4.0–10.5)

## 2018-12-30 LAB — URINALYSIS, ROUTINE W REFLEX MICROSCOPIC
Bilirubin Urine: NEGATIVE
GLUCOSE, UA: NEGATIVE mg/dL
Hgb urine dipstick: NEGATIVE
Ketones, ur: NEGATIVE mg/dL
Leukocytes,Ua: NEGATIVE
Nitrite: NEGATIVE
PH: 6 (ref 5.0–8.0)
PROTEIN: NEGATIVE mg/dL
Specific Gravity, Urine: 1.017 (ref 1.005–1.030)

## 2018-12-30 LAB — COMPREHENSIVE METABOLIC PANEL
ALBUMIN: 3.9 g/dL (ref 3.5–5.0)
ALK PHOS: 102 U/L (ref 38–126)
ALT: 15 U/L (ref 0–44)
AST: 15 U/L (ref 15–41)
Anion gap: 7 (ref 5–15)
BUN: 10 mg/dL (ref 6–20)
CHLORIDE: 104 mmol/L (ref 98–111)
CO2: 26 mmol/L (ref 22–32)
CREATININE: 0.57 mg/dL (ref 0.44–1.00)
Calcium: 8.7 mg/dL — ABNORMAL LOW (ref 8.9–10.3)
GFR calc Af Amer: >60 mL/min (ref >60)
GFR calc non Af Amer: >60 mL/min (ref >60)
GLUCOSE: 91 mg/dL (ref 70–99)
Potassium: 3.8 mmol/L (ref 3.5–5.1)
Sodium: 137 mmol/L (ref 135–145)
Total Bilirubin: 1 mg/dL (ref 0.3–1.2)
Total Protein: 7.9 g/dL (ref 6.5–8.1)

## 2018-12-30 LAB — I-STAT BETA HCG BLOOD, ED (MC, WL, AP ONLY): I-stat hCG, quantitative: <5 m[IU]/mL (ref <5)

## 2018-12-30 LAB — LIPASE, BLOOD: LIPASE: 31 U/L (ref 11–51)

## 2018-12-30 MED ORDER — KETOROLAC TROMETHAMINE 30 MG/ML IJ SOLN
30.0000 mg | Freq: Once | INTRAMUSCULAR | Status: AC
  Administered 2018-12-30: 30 mg via INTRAVENOUS
  Filled 2018-12-30: qty 1

## 2018-12-30 MED ORDER — SODIUM CHLORIDE 0.9 % IV BOLUS
1000.0000 mL | Freq: Once | INTRAVENOUS | Status: AC
  Administered 2018-12-30: 1000 mL via INTRAVENOUS

## 2018-12-30 MED ORDER — MORPHINE SULFATE (PF) 4 MG/ML IV SOLN
4.0000 mg | Freq: Once | INTRAVENOUS | Status: AC
  Administered 2018-12-30: 4 mg via INTRAVENOUS
  Filled 2018-12-30: qty 1

## 2018-12-30 MED ORDER — SODIUM CHLORIDE 0.9% FLUSH: 3.0000 mL | Freq: Once | INTRAVENOUS | Status: DC

## 2018-12-30 MED ORDER — ONDANSETRON HCL 4 MG/2ML IJ SOLN
4.0000 mg | Freq: Once | INTRAMUSCULAR | Status: AC
  Administered 2018-12-30: 4 mg via INTRAVENOUS
  Filled 2018-12-30: qty 2

## 2018-12-30 NOTE — ED Provider Notes (Signed)
[facility] DEPT Provider Note   CSN: [id number] Arrival date & time: 12/30/18  1802    History   Chief Complaint Chief Complaint  Patient presents with  . Abdominal Pain  . Nausea  . Migraine    HPI Cathy Yang is a 33 y.o. female with history of scoliosis status post lumbar spine surgery, obesity, gastric bypass surgery 2018, cholecystectomy, C-section is here for evaluation of abdominal pain.  Pain is described as sharp, constant, nonradiating.  Located to the left upper quadrant.  8/10.  The pain began suddenly around 11 PM last night, an hour after she took a muscle relaxer for the first time.  No interventions for this.  No alleviating or aggravating factors.  She has not eaten or drank anything the last 24 hours because she is afraid is going to hurt her.  Associated symptoms include nausea, nonbilious nonbloody emesis x2, diarrhea x4 without obvious blood or melena, generalized headache.  Since her gastric bypass the only issue she has had was episodic, postprandial abdominal pain but this has since stopped since cholecystectomy recently.  She denies fevers, chills, chest pain, shortness of breath, dysuria, hematuria, frequency or urgency.  No history of kidney stones.     HPI  Past Medical History:  Diagnosis Date  . Anxiety   . Chronic cholecystitis   . Hypertension   . Nausea & vomiting    PAIN ALSO WITH GALLBALDDER PROBLEM LAST 3 MONTHS  . Scoliosis     Patient Active Problem List   Diagnosis Date Noted  . S/P laparoscopic cholecystectomy Sept 2019 07/10/2018  . 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 CESAREAN SECTION     X1  . CHOLECYSTECTOMY N/A 07/10/2018   Procedure: LAPAROSCOPIC CHOLECYSTECTOMY WITH INTRAOPERATIVE CHOLANGIOGRAM;  Surgeon: Johnathan Hausen, MD;  Location: [facility] ORS;  Service: General;  Laterality: N/A;  . FIXATION KYPHOPLASTY  AGE 60  .  LAPAROSCOPIC GASTRIC SLEEVE RESECTION N/A 10/08/2017   Procedure: LAPAROSCOPIC GASTRIC SLEEVE RESECTION WITH UPPER ENDOSCOPY;  Surgeon: Johnathan Hausen, MD;  Location: [facility] ORS;  Service: General;  Laterality: N/A;     OB History   No obstetric history on file.      Home Medications    Prior to Admission medications   Medication Sig Start Date End Date Taking? Authorizing Provider  acetaminophen (TYLENOL) 500 MG tablet Take 1,000 mg by mouth every 6 (six) hours as needed for moderate pain or headache.   Yes [provider]  busPIRone (BUSPAR) 7.5 MG tablet Take 7.5 mg by mouth daily.    Yes [provider]  levonorgestrel (MIRENA) 20 MCG/24HR IUD 1 each by Intrauterine route once. Change every 5 years   Yes [provider]  spironolactone (ALDACTONE) 25 MG tablet Take 25 mg by mouth daily.   Yes [provider]  HYDROcodone-acetaminophen (NORCO/VICODIN) 5-325 MG tablet Take 1-2 tablets by mouth every 4 (four) hours as needed for moderate pain. Patient not taking: Reported on 12/30/2018 07/11/18   Johnathan Hausen, MD  ondansetron [facility] ODT) 4 MG disintegrating tablet Take 1 tablet (4 mg total) by mouth every 8 (eight) hours as needed for nausea or vomiting. 12/31/18   Kinnie Feil, PA-C    Family History Family History  Problem Relation Age of Onset  . Hypertension Mother   . Diabetes Mother   . Cancer Other     Social History Social History   Tobacco Use  .  Smoking status: Never Smoker  . Smokeless tobacco: Never Used  Substance Use Topics  . Alcohol use: Not Currently  . Drug use: No     Allergies   Nsaids   Review of Systems Review of Systems  Gastrointestinal: Positive for abdominal pain, diarrhea, nausea and vomiting.  All other systems reviewed and are negative.    Physical Exam Updated Vital Signs BP (!) 118/55   Pulse 88   Temp 99.9 F (37.7 C) (Oral)   Resp 16   SpO2 98%   Physical Exam Vitals signs and  nursing note reviewed.  Constitutional:      Appearance: She is well-developed.     Comments: Non toxic  HENT:     Head: Normocephalic and atraumatic.     Nose: Nose normal.  Eyes:     Conjunctiva/sclera: Conjunctivae normal.     Pupils: Pupils are equal, round, and reactive to light.  Neck:     Musculoskeletal: Normal range of motion.  Cardiovascular:     Rate and Rhythm: Normal rate and regular rhythm.     Pulses:          Radial pulses are 1+ on the right side and 1+ on the left side.       Dorsalis pedis pulses are 1+ on the right side and 1+ on the left side.  Pulmonary:     Effort: Pulmonary effort is normal.     Breath sounds: Normal breath sounds.  Abdominal:     General: Bowel sounds are normal.     Palpations: Abdomen is soft.     Tenderness: There is abdominal tenderness in the left upper quadrant. There is left CVA tenderness.     Comments: Exquisite left CVA tenderness.  Moderate left upper quadrant and left mid abdominal tenderness.  No guarding, rigidity.  Obese abdomen.  No suprapubic tenderness.  Negative Murphy's and McBurney's.  Multiple old, well-healed surgical scars noted to the abdomen.  Active bowel sounds to lower quadrants.    Musculoskeletal: Normal range of motion.  Skin:    General: Skin is warm and dry.     Capillary Refill: Capillary refill takes less than 2 seconds.  Neurological:     Mental Status: She is alert and oriented to person, place, and time.  Psychiatric:        Behavior: Behavior normal.      ED Treatments / Results  Labs (all labs ordered are listed, but only abnormal results are displayed) Labs Reviewed  COMPREHENSIVE METABOLIC PANEL - Abnormal; Notable for the following components:      Result Value   Calcium 8.7 (*)    All other components within normal limits  URINALYSIS, ROUTINE W REFLEX MICROSCOPIC - Abnormal; Notable for the following components:   APPearance HAZY (*)    All other components within normal limits    LIPASE, BLOOD  CBC  I-STAT BETA HCG BLOOD, ED ([facility], [facility], [facility] ONLY)    EKG None  Radiology Ct Abdomen Pelvis W Contrast  Result Date: 12/31/2018 CLINICAL DATA:  Back pain followed by abdominal pain, nausea, and diarrhea. EXAM: CT ABDOMEN AND PELVIS WITH CONTRAST TECHNIQUE: Multidetector CT imaging of the abdomen and pelvis was performed using the standard protocol following bolus administration of intravenous contrast. CONTRAST:  182m ISOVUE-300 IOPAMIDOL (ISOVUE-300) INJECTION 61% COMPARISON:  None. FINDINGS: Lower chest: Lung bases are clear. Hepatobiliary: No focal liver abnormality is seen. Status post cholecystectomy. No biliary dilatation. Pancreas: Unremarkable. No pancreatic ductal dilatation or surrounding inflammatory  changes. Spleen: Normal in size without focal abnormality. Adrenals/Urinary Tract: Adrenal glands are unremarkable. Kidneys are normal, without renal calculi, focal lesion, or hydronephrosis. Bladder is unremarkable. Stomach/Bowel: Postoperative changes consistent with gastric sleeve procedure. Stomach, small bowel, and colon are mostly decompressed. Scattered stool in the colon. No wall thickening or inflammatory changes appreciated. Appendix is normal. Vascular/Lymphatic: No significant vascular findings are present. No enlarged abdominal or pelvic lymph nodes. Reproductive: Uterus and ovaries are not enlarged. Probable involuting cyst on the right ovary. Intrauterine device centrally in the uterus. Other: No free air or free fluid in the abdomen. Abdominal wall musculature appears intact. Musculoskeletal: Postoperative changes with posterior fixation of the thoracolumbar spine down to the level of L2. Proximal extent is not included. No destructive bone lesions. IMPRESSION: No acute process demonstrated in the abdomen or pelvis. Postoperative changes consistent with gastric sleeve procedure. Intrauterine device centrally in the uterus. Electronically Signed   By: Lucienne Capers  M.D.   On: 12/31/2018 00:52    Procedures Procedures (including critical care time)  Medications Ordered in ED Medications  sodium chloride flush (NS) 0.9 % injection 3 mL (has no administration in time range)  iopamidol (ISOVUE-300) 61 % injection (has no administration in time range)  sodium chloride (PF) 0.9 % injection (has no administration in time range)  morphine 4 MG/ML injection 4 mg (4 mg Intravenous Given 12/30/18 2256)  ondansetron (ZOFRAN) injection 4 mg (4 mg Intravenous Given 12/30/18 2252)  sodium chloride 0.9 % bolus 1,000 mL (1,000 mLs Intravenous New Bag/Given 12/30/18 2250)  ketorolac (TORADOL) 30 MG/ML injection 30 mg (30 mg Intravenous Given 12/30/18 2253)  iopamidol (ISOVUE-300) 61 % injection 100 mL (100 mLs Intravenous Contrast Given 12/31/18 0011)     Initial Impression / Assessment and Plan / ED Course  I have reviewed the triage vital signs and the nursing notes.  Pertinent labs & imaging results that were available during my care of the patient were reviewed by me and considered in my medical decision making (see chart for details).  Clinical Course as of Dec 31 119  Mon Dec 30, 2018  2353 Reevaluated patient after medicine.  Reports minimal improvement in abdominal pain.  Discussed benign labs.  Given surgical history, refractory pain will obtain CT A/P.   [CG]    Clinical Course User Index [CG] Kinnie Feil, PA-C       DDX includes viral gastroenteritis versus gastritis versus medication adverse effect.  She has no urinary symptoms, history of kidney stones but has very obvious left CVA tenderness.  Slightly tachycardic but likely from pain or dehydration.  No sirs criteria met.  I consider AAA, perforated viscus, SBO, diverticulitis, dissection, mesenteric ischemia, appendicitis given her history and overall reassuring physical exam.  No peritonitis. Labs reviewed and unremarkable, no leukocytosis, anemia, electrolyte abnormalities.  Creatinine and  LFTs WNL.  Pending urinalysis.  Consider CT renal vs CTAP for further evaluation if symptoms do not respond to meds.   0118: Labs unremarkable.  Urinalysis unremarkable.  Pregnancy test negative.  Symptoms were refractory to medications.  Given abdominal surgical history, refractory symptoms CT A/P was obtained which was unremarkable.  Discussed work-up with patient.  Symptoms may be from virus versus gastritis.  We will discharge with omeprazole, Zofran, Tylenol.  She has no associated CP, SOB, pleuritic pain, tachypnea, hypoxia.  Presentation is not consistent with ACS or PE. She is to monitor her symptoms if persistent follow-up with gastric bypass physician.  Return precautions were discussed.  Patient is comfortable with this.  Final Clinical Impressions(s) / ED Diagnoses   Final diagnoses:  Left upper quadrant abdominal pain of unknown etiology  History of gastric bypass    ED Discharge Orders         Ordered    ondansetron (ZOFRAN ODT) 4 MG disintegrating tablet  Every 8 hours PRN     12/31/18 0120           Kinnie Feil, PA-C 12/31/18 0121    Daleen Bo, MD 01/01/19 1014

## 2018-12-30 NOTE — ED Triage Notes (Signed)
Patient arrived via POV.  C/O abdominal pain that started last night   Pateint took muscle relaxer for back pain around 11 pm and after developed abdominal pain ( [place] 8/10) This was the first time patient has taken muscle relaxer before   C/o nausea and diarrhea   3 occurences of diarrhea in past 24 hours  C/o migraine that started today around 10 am    Hx. Gastric sleeve a 1 ago   Ambulatory in triage  A/Ox4

## 2018-12-31 ENCOUNTER — Emergency Department (HOSPITAL_COMMUNITY): Payer: 59

## 2018-12-31 ENCOUNTER — Encounter (HOSPITAL_COMMUNITY): Payer: Self-pay

## 2018-12-31 MED ORDER — ONDANSETRON 4 MG PO TBDP: 4.0000 mg | ORAL_TABLET | Freq: Three times a day (TID) | ORAL | 0 refills | Status: DC | PRN

## 2018-12-31 MED ORDER — IOPAMIDOL (ISOVUE-300) INJECTION 61%
INTRAVENOUS | Status: AC
  Filled 2018-12-31: qty 100

## 2018-12-31 MED ORDER — IOPAMIDOL (ISOVUE-300) INJECTION 61%
100.0000 mL | Freq: Once | INTRAVENOUS | Status: AC | PRN
  Administered 2018-12-31: 100 mL via INTRAVENOUS

## 2018-12-31 MED ORDER — SODIUM CHLORIDE (PF) 0.9 % IJ SOLN
INTRAMUSCULAR | Status: AC
  Filled 2018-12-31: qty 50

## 2018-12-31 NOTE — Discharge Instructions (Signed)
You were seen in the ER for abdominal pain, nausea, vomiting, diarrhea.  Labs and CT are unremarkable.  Symptoms may be from a virus or stomach inflammation.  All of these are managed similarly.   We will treat this with anti-acid medicines.  Start taking over the counter omeprazole to 40 mg daily, take on an empty stomach first thing in the morning and wait 20-30 min before eating.  Zofran for nausea.  Avoid irritating foods and liquids such as alcohol, greasy/fatty or acidic foods. Avoid ibuprofen containing products.   Follow up with gastric bypass doctor for further management of this if it persits.   Return to the ER for fever, chills, blood in vomit or stool, worsening localized abdominal pain to right upper or right lower abdomen, inability to tolerate fluids.

## 2019-09-09 ENCOUNTER — Ambulatory Visit (HOSPITAL_COMMUNITY)
Admission: EM | Admit: 2019-09-09 | Discharge: 2019-09-09 | Disposition: A | Discharge status: Home / Self Care | Payer: HRSA Program | Attending: Family Medicine | Admitting: Family Medicine

## 2019-09-09 ENCOUNTER — Other Ambulatory Visit: Payer: Self-pay

## 2019-09-09 ENCOUNTER — Encounter (HOSPITAL_COMMUNITY): Payer: Self-pay

## 2019-09-09 DIAGNOSIS — U071 COVID-19: Secondary | ICD-10-CM | POA: Insufficient documentation

## 2019-09-09 DIAGNOSIS — Z3202 Encounter for pregnancy test, result negative: Secondary | ICD-10-CM

## 2019-09-09 DIAGNOSIS — I10 Essential (primary) hypertension: Secondary | ICD-10-CM

## 2019-09-09 DIAGNOSIS — R519 Headache, unspecified: Secondary | ICD-10-CM

## 2019-09-09 DIAGNOSIS — M419 Scoliosis, unspecified: Secondary | ICD-10-CM | POA: Insufficient documentation

## 2019-09-09 DIAGNOSIS — R1013 Epigastric pain: Secondary | ICD-10-CM

## 2019-09-09 LAB — POCT URINALYSIS DIP (DEVICE)
Bilirubin Urine: NEGATIVE
Glucose, UA: NEGATIVE mg/dL
Hgb urine dipstick: NEGATIVE
Ketones, ur: NEGATIVE mg/dL
Leukocytes,Ua: NEGATIVE
Nitrite: NEGATIVE
Protein, ur: NEGATIVE mg/dL
Specific Gravity, Urine: 1.02 (ref 1.005–1.030)
Urobilinogen, UA: 0.2 mg/dL (ref 0.0–1.0)
pH: 7 (ref 5.0–8.0)

## 2019-09-09 LAB — POCT PREGNANCY, URINE: Preg Test, Ur: NEGATIVE

## 2019-09-09 MED ORDER — TRAMADOL HCL 50 MG PO TABS: 50.0000 mg | ORAL_TABLET | Freq: Four times a day (QID) | ORAL | 0 refills | Status: DC | PRN

## 2019-09-09 NOTE — ED Triage Notes (Signed)
Pt presents with ongoing headache, generalized body ache, and generalized abdominal pain X 1 week.

## 2019-09-09 NOTE — Discharge Instructions (Addendum)
If your Covid-19 test is positive, you will get a phone call from [facility] regarding your results. If your Covid-19 test is negative, you will NOT get a phone call from [facility] with your results. You may view your results on MyChart. If you do not have a MyChart account, sign up instructions are in your discharge papers.   Your blood pressure was noted to be elevated during your visit today. You may return here within the next few days to recheck if unable to see your primary care doctor. If your blood pressure remains persistently elevated, you may need to begin taking a medication.  BP (!) 139/107 (BP Location: Right Arm)    Pulse 68    Temp 98.6 F (37 C) (Oral)    Resp 17    SpO2 98%    Please seek prompt medical care if: You have: A very bad (severe) headache that is not helped by medicine. Trouble walking or weakness in your arms and legs. Clear or bloody fluid coming from your nose or ears. Changes in your seeing (vision). Jerky movements that you cannot control (seizure). You throw up (vomit). You lose balance. Your speech is slurred. You pass out. The black centers of your eyes (pupils) change in size.  These symptoms may be an emergency. Do not wait to see if the symptoms will go away. Get medical help right away. Call your local emergency services. Do not drive yourself to the hospital.

## 2019-09-10 NOTE — ED Provider Notes (Signed)
[place]   [id number] 09/09/19 Arrival Time: 1850  ASSESSMENT & PLAN:  1. Frequent headaches   2. Uncontrolled hypertension   3. Epigastric pain     COVID testing sent.  Normal neurological exam. Afebrile without nuchal rigidity. Discussed. Current presentation and symptoms are consistent with prior migraines and are not consistent with SAH, ICH, meningitis, or temporal arteritis. Without fever, focal neuro logical deficits, nuchal rigidity, or change in vision. No indication for neurodiagnostic workup at this time. Discussed.    Discharge Instructions     If your Covid-19 test is positive, you will get a phone call from [facility] regarding your results. If your Covid-19 test is negative, you will NOT get a phone call from [facility] with your results. You may view your results on MyChart. If you do not have a MyChart account, sign up instructions are in your discharge papers.   Your blood pressure was noted to be elevated during your visit today. You may return here within the next few days to recheck if unable to see your primary care doctor. If your blood pressure remains persistently elevated, you may need to begin taking a medication.  BP (!) 139/107 (BP Location: Right Arm)   Pulse 68   Temp 98.6 F (37 C) (Oral)   Resp 17   SpO2 98%    Please seek prompt medical care if:  You have: ? A very bad (severe) headache that is not helped by medicine. ? Trouble walking or weakness in your arms and legs. ? Clear or bloody fluid coming from your nose or ears. ? Changes in your seeing (vision). ? Jerky movements that you cannot control (seizure).  You throw up (vomit).  You lose balance.  Your speech is slurred.  You pass out.  The black centers of your eyes (pupils) change in size.  These symptoms may be an emergency. Do not wait to see if the symptoms will go away. Get medical help right away. Call your local emergency services. Do not drive  yourself to the hospital.     Benign abdominal exam. Reassured. She plans to call for PCP f/u to discussed BP. Likely needs to begin treatment. Does not wish to do so this evening.  Reviewed expectations re: course of current medical issues. Questions answered. Outlined signs and symptoms indicating need for more acute intervention. Patient verbalized understanding. After Visit Summary given.   SUBJECTIVE: History from: Patient. Patient is able to give a clear and coherent history.  Alia Spillers is a 33 y.o. female who presents with complaint of a frequent headaches. Onset gradual, first noted 1-2 weeks ago. "Pretty much every day" but does have periods where she is without a headache. Seems worse during the day. Headaches do not wake her at night. Location: generalized but "sometimes more on the right side of my head" without radiation. History of headaches: infrequent. Precipitating factors include: none which have been determined. Associated symptoms: Preceding aura: no. Nausea/vomiting: no. Vision changes: no. Increased sensitivity to light and to noises: no. Fever: no. Sinus pressure/congestion: "a little". Extremity weakness: no. Home treatment has included Tylenol with temporary improvement. Current headache has not limited normal daily activities. Denies decreased physical activity, depression, dizziness, loss of balance, muscle weakness, numbness of extremities, speech difficulties, vision problems and vomiting in the early morning. No head injury reported. Ambulatory without difficulty. No recent travel.  Increased blood pressure noted today. Reports that she has not been treated for hypertension in the past.  She reports no chest pain on exertion, no dyspnea on exertion, no swelling of ankles, no orthostatic dizziness or lightheadedness, no orthopnea or paroxysmal nocturnal dyspnea, no palpitations and no intermittent claudication symptoms. Questions relation to  headaches.  She also mentions mild and infrequent abdominal discomfort. More L-sided. Sporadic. No known triggers. Does not think related to PO intake. No bothering her too much now. "Just thought I'd mention it". Normal PO intake without n/v/d. No urinary symptoms or vaginal discharge. No constipation.  ROS: As per HPI. All other systems negative.    OBJECTIVE:  Vitals:   09/09/19 1900  BP: (!) 139/107  Pulse: 68  Resp: 17  Temp: 98.6 F (37 C)  TempSrc: Oral  SpO2: 98%    General appearance: alert; NAD but does appear fatigued HENT: normocephalic; atraumatic Eyes: PERRLA; EOMI; conjunctivae normal Neck: supple with FROM Lungs: clear to auscultation bilaterally; unlabored respirations Heart: regular rate and rhythm Abd: obese; soft; no areas of TTP Extremities: no edema; symmetrical with no gross deformities Skin: warm and dry Neurologic: alert; speech is fluent and clear without dysarthria or aphasia; CN 2-12 grossly intact; no facial droop; normal gait; normal symmetric reflexes; normal extremity strength and sensation throughout; bilateral upper and lower extremity sensation is grossly intact with 5/5 symmetric strength Psychological: alert and cooperative; normal mood and affect  Labs: Results for orders placed or performed during the hospital encounter of 09/09/19  POCT urinalysis dip (device)  Result Value Ref Range   Glucose, UA NEGATIVE NEGATIVE mg/dL   Bilirubin Urine NEGATIVE NEGATIVE   Ketones, ur NEGATIVE NEGATIVE mg/dL   Specific Gravity, Urine 1.020 1.005 - 1.030   Hgb urine dipstick NEGATIVE NEGATIVE   pH 7.0 5.0 - 8.0   Protein, ur NEGATIVE NEGATIVE mg/dL   Urobilinogen, UA 0.2 0.0 - 1.0 mg/dL   Nitrite NEGATIVE NEGATIVE   Leukocytes,Ua NEGATIVE NEGATIVE  Pregnancy, urine POC  Result Value Ref Range   Preg Test, Ur NEGATIVE NEGATIVE   Labs Reviewed  NOVEL CORONAVIRUS, NAA (HOSP ORDER, SEND-OUT TO REF LAB; TAT 18-24 HRS)  POC URINE PREG, ED  POCT  URINALYSIS DIP (DEVICE)  POCT PREGNANCY, URINE     Allergies  Allergen Reactions  . Nsaids Other (See Comments)    Unable to take due to gastric sleeve    Past Medical History:  Diagnosis Date  . Anxiety   . Chronic cholecystitis   . Hypertension   . Nausea & vomiting    PAIN ALSO WITH GALLBALDDER PROBLEM LAST 3 MONTHS  . Scoliosis    Social History   Socioeconomic History  . Marital status: Single    Spouse name: Not on file  . Number of children: Not on file  . Years of education: Not on file  . Highest education level: Not on file  Occupational History  . Not on file  Social Needs  . Financial resource strain: Not on file  . Food insecurity    Worry: Not on file    Inability: Not on file  . Transportation needs    Medical: Not on file    Non-medical: Not on file  Tobacco Use  . Smoking status: Never Smoker  . Smokeless tobacco: Never Used  Substance and Sexual Activity  . Alcohol use: Not Currently  . Drug use: No  . Sexual activity: Yes  Lifestyle  . Physical activity    Days per week: Not on file    Minutes per session: Not on file  . Stress: Not  on file  Relationships  . Social Musicianconnections    Talks on phone: Not on file    Gets together: Not on file    Attends religious service: Not on file    Active member of club or organization: Not on file    Attends meetings of clubs or organizations: Not on file    Relationship status: Not on file  . Intimate partner violence    Fear of current or ex partner: Not on file    Emotionally abused: Not on file    Physically abused: Not on file    Forced sexual activity: Not on file  Other Topics Concern  . Not on file  Social History Narrative  . Not on file   Family History  Problem Relation Age of Onset  . Hypertension Mother   . Diabetes Mother   . Cancer Other    Past Surgical History:  Procedure Laterality Date  . CESAREAN SECTION     X1  . CHOLECYSTECTOMY N/A 07/10/2018   Procedure:  LAPAROSCOPIC CHOLECYSTECTOMY WITH INTRAOPERATIVE CHOLANGIOGRAM;  Surgeon: Luretha MurphyMartin, Matthew, MD;  Location: [facility];  Service: General;  Laterality: N/A;  . FIXATION KYPHOPLASTY  AGE 81  . LAPAROSCOPIC GASTRIC SLEEVE RESECTION N/A 10/08/2017   Procedure: LAPAROSCOPIC GASTRIC SLEEVE RESECTION WITH UPPER ENDOSCOPY;  Surgeon: Luretha MurphyMartin, Matthew, MD;  Location: [facility];  Service: General;  Laterality: Vertis KelchN/A;     Markanthony Gedney, MD 09/10/19 [phone]

## 2019-09-11 LAB — NOVEL CORONAVIRUS, NAA (HOSP ORDER, SEND-OUT TO REF LAB; TAT 18-24 HRS): SARS-CoV-2, NAA: DETECTED — AB

## 2019-09-12 ENCOUNTER — Telehealth: Payer: Self-pay | Admitting: Emergency Medicine

## 2019-09-12 NOTE — Telephone Encounter (Signed)
Positive covid. Contacted patient and made her aware. All questions answered.

## 2019-10-31 ENCOUNTER — Emergency Department (HOSPITAL_COMMUNITY)
Admission: EM | Admit: 2019-10-31 | Discharge: 2019-11-01 | Disposition: A | Discharge status: Home / Self Care | Payer: Medicaid Other | Attending: Emergency Medicine | Admitting: Emergency Medicine

## 2019-10-31 ENCOUNTER — Other Ambulatory Visit: Payer: Self-pay

## 2019-10-31 ENCOUNTER — Emergency Department (HOSPITAL_COMMUNITY): Payer: Medicaid Other

## 2019-10-31 ENCOUNTER — Encounter (HOSPITAL_COMMUNITY): Payer: Self-pay

## 2019-10-31 DIAGNOSIS — N939 Abnormal uterine and vaginal bleeding, unspecified: Secondary | ICD-10-CM | POA: Diagnosis present

## 2019-10-31 DIAGNOSIS — Z79899 Other long term (current) drug therapy: Secondary | ICD-10-CM | POA: Diagnosis not present

## 2019-10-31 DIAGNOSIS — I10 Essential (primary) hypertension: Secondary | ICD-10-CM | POA: Diagnosis not present

## 2019-10-31 DIAGNOSIS — R1032 Left lower quadrant pain: Secondary | ICD-10-CM | POA: Diagnosis not present

## 2019-10-31 LAB — URINALYSIS, ROUTINE W REFLEX MICROSCOPIC
Bacteria, UA: NONE SEEN
Bilirubin Urine: NEGATIVE
Glucose, UA: NEGATIVE mg/dL
Hgb urine dipstick: NEGATIVE
Ketones, ur: 20 mg/dL — AB
Leukocytes,Ua: NEGATIVE
Nitrite: NEGATIVE
Protein, ur: 30 mg/dL — AB
Specific Gravity, Urine: 1.029 (ref 1.005–1.030)
pH: 5 (ref 5.0–8.0)

## 2019-10-31 LAB — POC URINE PREG, ED: Preg Test, Ur: NEGATIVE

## 2019-10-31 LAB — CBC WITH DIFFERENTIAL/PLATELET
Abs Immature Granulocytes: 0.01 10*3/uL (ref 0.00–0.07)
Basophils Absolute: 0 10*3/uL (ref 0.0–0.1)
Basophils Relative: 1 %
Eosinophils Absolute: 0.1 10*3/uL (ref 0.0–0.5)
Eosinophils Relative: 2 %
HCT: 44 % (ref 36.0–46.0)
Hemoglobin: 14 g/dL (ref 12.0–15.0)
Immature Granulocytes: 0 %
Lymphocytes Relative: 46 %
Lymphs Abs: 2.7 10*3/uL (ref 0.7–4.0)
MCH: 30.1 pg (ref 26.0–34.0)
MCHC: 31.8 g/dL (ref 30.0–36.0)
MCV: 94.6 fL (ref 80.0–100.0)
Monocytes Absolute: 0.5 10*3/uL (ref 0.1–1.0)
Monocytes Relative: 8 %
Neutro Abs: 2.5 10*3/uL (ref 1.7–7.7)
Neutrophils Relative %: 43 %
Platelets: 321 10*3/uL (ref 150–400)
RBC: 4.65 MIL/uL (ref 3.87–5.11)
RDW: 14.2 % (ref 11.5–15.5)
WBC: 5.9 10*3/uL (ref 4.0–10.5)
nRBC: 0 % (ref 0.0–0.2)

## 2019-10-31 LAB — BASIC METABOLIC PANEL
Anion gap: 8 (ref 5–15)
BUN: 7 mg/dL (ref 6–20)
CO2: 23 mmol/L (ref 22–32)
Calcium: 8.7 mg/dL — ABNORMAL LOW (ref 8.9–10.3)
Chloride: 105 mmol/L (ref 98–111)
Creatinine, Ser: 0.71 mg/dL (ref 0.44–1.00)
GFR calc Af Amer: >60 mL/min (ref >60)
GFR calc non Af Amer: >60 mL/min (ref >60)
Glucose, Bld: 91 mg/dL (ref 70–99)
Potassium: 3.7 mmol/L (ref 3.5–5.1)
Sodium: 136 mmol/L (ref 135–145)

## 2019-10-31 MED ORDER — MORPHINE SULFATE (PF) 4 MG/ML IV SOLN
4.0000 mg | Freq: Once | INTRAVENOUS | Status: DC
  Filled 2019-10-31: qty 1

## 2019-10-31 MED ORDER — MORPHINE SULFATE (PF) 4 MG/ML IV SOLN
4.0000 mg | Freq: Once | INTRAVENOUS | Status: AC
  Administered 2019-11-01: 4 mg via INTRAVENOUS

## 2019-10-31 MED ORDER — OXYCODONE-ACETAMINOPHEN 5-325 MG PO TABS
1.0000 | ORAL_TABLET | ORAL | Status: AC | PRN
  Administered 2019-10-31 (×2): 1 via ORAL
  Filled 2019-10-31 (×2): qty 1

## 2019-10-31 NOTE — ED Triage Notes (Signed)
Pt reports 3 days of lower abd pain and vaginal spotting. Pt has IUD and believes it has moved.

## 2019-10-31 NOTE — ED Provider Notes (Signed)
Lower AP, vag bleeding Has IUD - ? Complication (pt) LLQ ap vs pelvic pain Pelvic US pending, if negative consider CT  Recheck pain control  11:45 - Pelvic US resulted - no acute findings. IUD in place.   Re-evaluation: the patient is having increased pain. Tender to palpation in the LLQ. Will proceed with CT scan as discussed with previous treatment team. Patient is agreeable.   2:45 - review of CT shows no cause for pain. Normal bowel, no inflammation.   On re-examination, she is still tender. Skin examined and there is no redness, induration to suggest panniculitis (no soft tissue swelling or fluid collection on CT), no rash to suggest zoster.   She can be discharged home and will need to follow up with her doctor in 2 days for recheck. Return precautions discussed.    Elpidio Anis, PA-C 11/01/19 0251    Sabas Sous, MD 11/01/19 [phone]

## 2019-10-31 NOTE — ED Provider Notes (Signed)
[facility] Provider Note   CSN: [id number] Arrival date & time: 10/31/19  1742     History Chief Complaint  Patient presents with  . Abdominal Pain  . Vaginal Bleeding    [facility] is a 34 y.o. female presenting for evaluation of left sided abdominal pain and vaginal bleeding.  Patient states she developed severe lower abdominal pain yesterday.  It is suprapubic and to the left side.  It is constant.  It does not improve with Tylenol, she is allergic to NSAIDs.  She states today she started to develop vaginal bleeding, she does not normally have.  That she has an IUD.  She denies fevers, chills, nausea, vomiting, upper abdominal pain.  She denies urinary symptoms or abnormal bowel movements.  She states she has had an IUD for 5 years, has not had any issues.  She is sexually active with one female partner, does not use condoms.  She does not have any vaginal discharge and he is symptom-free.  Pt has an ob/gyn, next apt scheduled 01/20.   Additional history obtained from chart review.  Patient with a history of C-section, cholecystectomy, gastric sleeve  HPI     Past Medical History:  Diagnosis Date  . Anxiety   . Chronic cholecystitis   . Hypertension   . Nausea & vomiting    PAIN ALSO WITH GALLBALDDER PROBLEM LAST 3 MONTHS  . Scoliosis     Patient Active Problem List   Diagnosis Date Noted  . S/P laparoscopic cholecystectomy Sept 2019 07/10/2018  . 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 CESAREAN SECTION     X1  . CHOLECYSTECTOMY N/A 07/10/2018   Procedure: LAPAROSCOPIC CHOLECYSTECTOMY WITH INTRAOPERATIVE CHOLANGIOGRAM;  Surgeon: Johnathan Hausen, MD;  Location: [facility];  Service: General;  Laterality: N/A;  . FIXATION KYPHOPLASTY  AGE 75  . LAPAROSCOPIC GASTRIC SLEEVE RESECTION N/A 10/08/2017   Procedure: LAPAROSCOPIC GASTRIC SLEEVE RESECTION WITH UPPER ENDOSCOPY;   Surgeon: Johnathan Hausen, MD;  Location: [facility];  Service: General;  Laterality: N/A;     OB History   No obstetric history on file.     Family History  Problem Relation Age of Onset  . Hypertension Mother   . Diabetes Mother   . Cancer Other     Social History   Tobacco Use  . Smoking status: Never Smoker  . Smokeless tobacco: Never Used  Substance Use Topics  . Alcohol use: Not Currently  . Drug use: No    Home Medications Prior to Admission medications   Medication Sig Start Date End Date Taking? Authorizing Provider  acetaminophen (TYLENOL) 500 MG tablet Take 1,000 mg by mouth every 6 (six) hours as needed for moderate pain or headache.    [provider]  busPIRone (BUSPAR) 7.5 MG tablet Take 7.5 mg by mouth daily.     [provider]  HYDROcodone-acetaminophen (NORCO/VICODIN) 5-325 MG tablet Take 1-2 tablets by mouth every 4 (four) hours as needed for moderate pain. Patient not taking: Reported on 12/30/2018 07/11/18   Johnathan Hausen, MD  levonorgestrel [facility]) 20 MCG/24HR IUD 1 each by Intrauterine route once. Change every 5 years    [provider]  ondansetron (ZOFRAN ODT) 4 MG disintegrating tablet Take 1 tablet (4 mg total) by mouth every 8 (eight) hours as needed for nausea or vomiting. 12/31/18   Kinnie Feil, PA-C  spironolactone (ALDACTONE) 25 MG tablet Take 25  mg by mouth daily.    [provider]  traMADol (ULTRAM) 50 MG tablet Take 1 tablet (50 mg total) by mouth every 6 (six) hours as needed. 09/09/19   Mardella Layman, MD    Allergies    Nsaids  Review of Systems   Review of Systems  Gastrointestinal: Positive for abdominal pain.  Genitourinary: Positive for pelvic pain and vaginal bleeding.  All other systems reviewed and are negative.   Physical Exam Updated Vital Signs BP 133/88 (BP Location: Left Arm)   Pulse 94   Temp 98.3 F (36.8 C) (Oral)   Resp 16   SpO2 100%   Physical Exam Vitals and  nursing note reviewed. Exam conducted with a chaperone present.  Constitutional:      General: She is not in acute distress.    Appearance: She is well-developed. She is obese.     Comments: Appears uncomfortable due to pain, nontoxic  HENT:     Head: Normocephalic and atraumatic.  Eyes:     Extraocular Movements: Extraocular movements intact.     Conjunctiva/sclera: Conjunctivae normal.     Pupils: Pupils are equal, round, and reactive to light.  Cardiovascular:     Rate and Rhythm: Normal rate and regular rhythm.  Pulmonary:     Effort: Pulmonary effort is normal. No respiratory distress.     Breath sounds: Normal breath sounds. No wheezing.  Abdominal:     General: There is no distension.     Palpations: Abdomen is soft. There is no mass.     Tenderness: There is abdominal tenderness. There is no guarding or rebound.     Comments: Tenderness palpation of left lower quadrant and inguinal region.  Exam limited due to body habitus.  No rigidity, guarding, distention.  Negative rebound.  No signs of peritonitis.  Genitourinary:    Cervix: Discharge and cervical bleeding present.     Comments: Minimal discharge.  No CMT or adnexal tenderness.  No overt vaginal bleeding noted on my exam. Musculoskeletal:        General: Normal range of motion.     Cervical back: Normal range of motion and neck supple.  Skin:    General: Skin is warm and dry.     Capillary Refill: Capillary refill takes less than 2 seconds.  Neurological:     Mental Status: She is alert and oriented to person, place, and time.     ED Results / Procedures / Treatments   Labs (all labs ordered are listed, but only abnormal results are displayed) Labs Reviewed  WET PREP, GENITAL  URINALYSIS, ROUTINE W REFLEX MICROSCOPIC  CBC WITH DIFFERENTIAL/PLATELET  BASIC METABOLIC PANEL  POC URINE PREG, ED  GC/CHLAMYDIA PROBE AMP ([place]) NOT AT [facility]    EKG None  Radiology No results  found.  Procedures Procedures (including critical care time)  Medications Ordered in ED Medications  morphine 4 MG/ML injection 4 mg (has no administration in time range)  oxyCODONE-acetaminophen (PERCOCET/ROXICET) 5-325 MG per tablet 1 tablet (1 tablet Oral Given 10/31/19 2122)    ED Course  I have reviewed the triage vital signs and the nursing notes.  Pertinent labs & imaging results that were available during my care of the patient were reviewed by me and considered in my medical decision making (see chart for details).    MDM Rules/Calculators/A&P                      Pt presenting for evaluation  of lower abd pain and vaginal bleeding.  On exam, patient appears uncomfortable, but nontoxic.  Exam is limited due to obesity.  GU exam without obvious discharge and no significant vaginal bleeding.  No CMT or obvious adnexal tenderness.  Consider PID versus TOA versus torsion.  Consider complication of the IUD.  However as pain appears more lower quadrant, also consider diverticulitis or GI issue.  However with the associated history of vaginal bleeding, favor GU over GI at this time.  Discussed with patient, will obtain ultrasound and reassess.  Labs obtained from triage are reassuring.  No leukocytosis.  Electrolytes stable.   Patient signed out to Leonia Reader, PA-C for follow-up on ultrasound.  If negative, reassess and consider possible need for CT  Final Clinical Impression(s) / ED Diagnoses Final diagnoses:  None    Rx / DC Orders ED Discharge Orders    None       Alveria Apley, PA-C 10/31/19 2214    Sabas Sous, MD 11/01/19 1530

## 2019-11-01 ENCOUNTER — Encounter (HOSPITAL_COMMUNITY): Payer: Self-pay | Admitting: Radiology

## 2019-11-01 ENCOUNTER — Emergency Department (HOSPITAL_COMMUNITY): Payer: Medicaid Other

## 2019-11-01 MED ORDER — IOHEXOL 300 MG/ML  SOLN
125.0000 mL | Freq: Once | INTRAMUSCULAR | Status: AC | PRN
  Administered 2019-11-01: 125 mL via INTRAVENOUS

## 2019-11-01 NOTE — Discharge Instructions (Addendum)
Continue Tylenol for pain, apply warm compresses for additional relief. Rest. Follow up with your doctor for recheck in 2 days and return to the emergency department with any worsening symptoms or new concerns.

## 2019-11-04 LAB — GC/CHLAMYDIA PROBE AMP (~~LOC~~) NOT AT ARMC
Chlamydia: NEGATIVE
Neisseria Gonorrhea: NEGATIVE

## 2019-11-05 DIAGNOSIS — E559 Vitamin D deficiency, unspecified: Secondary | ICD-10-CM | POA: Diagnosis not present

## 2019-11-05 DIAGNOSIS — R0600 Dyspnea, unspecified: Secondary | ICD-10-CM | POA: Diagnosis not present

## 2019-11-05 DIAGNOSIS — F329 Major depressive disorder, single episode, unspecified: Secondary | ICD-10-CM | POA: Diagnosis not present

## 2019-11-05 DIAGNOSIS — Z0001 Encounter for general adult medical examination with abnormal findings: Secondary | ICD-10-CM | POA: Diagnosis not present

## 2019-11-05 DIAGNOSIS — R102 Pelvic and perineal pain: Secondary | ICD-10-CM | POA: Diagnosis not present

## 2019-11-06 DIAGNOSIS — Z0001 Encounter for general adult medical examination with abnormal findings: Secondary | ICD-10-CM | POA: Diagnosis not present

## 2019-11-07 ENCOUNTER — Ambulatory Visit (HOSPITAL_COMMUNITY)
Admission: RE | Admit: 2019-11-07 | Discharge: 2019-11-07 | Disposition: A | Discharge status: Home / Self Care | Payer: Medicaid Other | Source: Ambulatory Visit | Attending: Cardiovascular Disease | Admitting: Cardiovascular Disease

## 2019-11-07 ENCOUNTER — Other Ambulatory Visit (HOSPITAL_COMMUNITY): Payer: Self-pay | Admitting: Family Medicine

## 2019-11-07 ENCOUNTER — Other Ambulatory Visit: Payer: Self-pay

## 2019-11-07 DIAGNOSIS — R0602 Shortness of breath: Secondary | ICD-10-CM | POA: Insufficient documentation

## 2019-11-07 DIAGNOSIS — F329 Major depressive disorder, single episode, unspecified: Secondary | ICD-10-CM | POA: Diagnosis not present

## 2019-11-10 ENCOUNTER — Encounter (HOSPITAL_COMMUNITY): Payer: Medicaid Other

## 2019-11-11 ENCOUNTER — Other Ambulatory Visit (HOSPITAL_COMMUNITY): Payer: Self-pay | Admitting: Family Medicine

## 2019-11-11 DIAGNOSIS — R0602 Shortness of breath: Secondary | ICD-10-CM

## 2019-11-11 DIAGNOSIS — R079 Chest pain, unspecified: Secondary | ICD-10-CM

## 2019-11-18 ENCOUNTER — Other Ambulatory Visit: Payer: Self-pay

## 2019-11-18 ENCOUNTER — Ambulatory Visit (HOSPITAL_COMMUNITY): Payer: Medicaid Other | Attending: Cardiovascular Disease

## 2019-11-18 DIAGNOSIS — R0602 Shortness of breath: Secondary | ICD-10-CM

## 2019-11-18 DIAGNOSIS — R079 Chest pain, unspecified: Secondary | ICD-10-CM | POA: Diagnosis not present

## 2019-11-18 MED ORDER — PERFLUTREN LIPID MICROSPHERE
1.0000 mL | INTRAVENOUS | Status: AC | PRN
  Administered 2019-11-18: 2 mL via INTRAVENOUS

## 2019-11-24 DIAGNOSIS — H5213 Myopia, bilateral: Secondary | ICD-10-CM | POA: Diagnosis not present

## 2019-11-24 DIAGNOSIS — H52223 Regular astigmatism, bilateral: Secondary | ICD-10-CM | POA: Diagnosis not present

## 2019-11-25 DIAGNOSIS — H5213 Myopia, bilateral: Secondary | ICD-10-CM | POA: Diagnosis not present

## 2019-11-25 DIAGNOSIS — R079 Chest pain, unspecified: Secondary | ICD-10-CM | POA: Diagnosis not present

## 2019-12-17 DIAGNOSIS — F329 Major depressive disorder, single episode, unspecified: Secondary | ICD-10-CM | POA: Diagnosis not present

## 2020-01-14 DIAGNOSIS — Z903 Acquired absence of stomach [part of]: Secondary | ICD-10-CM | POA: Diagnosis not present

## 2020-02-25 DIAGNOSIS — Z903 Acquired absence of stomach [part of]: Secondary | ICD-10-CM | POA: Diagnosis not present

## 2020-03-17 DIAGNOSIS — Z903 Acquired absence of stomach [part of]: Secondary | ICD-10-CM | POA: Diagnosis not present

## 2020-05-13 ENCOUNTER — Other Ambulatory Visit: Payer: Self-pay

## 2020-05-13 ENCOUNTER — Ambulatory Visit (INDEPENDENT_AMBULATORY_CARE_PROVIDER_SITE_OTHER): Payer: Medicaid Other | Admitting: Family Medicine

## 2020-05-13 ENCOUNTER — Encounter (INDEPENDENT_AMBULATORY_CARE_PROVIDER_SITE_OTHER): Payer: Self-pay | Admitting: Family Medicine

## 2020-05-13 VITALS — BP 123/74 | HR 86 | Temp 98.3°F | Ht 65.0 in | Wt 271.0 lb

## 2020-05-13 DIAGNOSIS — R5383 Other fatigue: Secondary | ICD-10-CM | POA: Diagnosis not present

## 2020-05-13 DIAGNOSIS — E739 Lactose intolerance, unspecified: Secondary | ICD-10-CM | POA: Diagnosis not present

## 2020-05-13 DIAGNOSIS — Z0289 Encounter for other administrative examinations: Secondary | ICD-10-CM

## 2020-05-13 DIAGNOSIS — F329 Major depressive disorder, single episode, unspecified: Secondary | ICD-10-CM

## 2020-05-13 DIAGNOSIS — F419 Anxiety disorder, unspecified: Secondary | ICD-10-CM

## 2020-05-13 DIAGNOSIS — E7849 Other hyperlipidemia: Secondary | ICD-10-CM | POA: Diagnosis not present

## 2020-05-13 DIAGNOSIS — Z6841 Body Mass Index (BMI) 40.0 and over, adult: Secondary | ICD-10-CM

## 2020-05-13 DIAGNOSIS — I10 Essential (primary) hypertension: Secondary | ICD-10-CM

## 2020-05-13 DIAGNOSIS — R0602 Shortness of breath: Secondary | ICD-10-CM | POA: Diagnosis not present

## 2020-05-13 DIAGNOSIS — F32A Depression, unspecified: Secondary | ICD-10-CM

## 2020-05-13 DIAGNOSIS — D6489 Other specified anemias: Secondary | ICD-10-CM

## 2020-05-13 DIAGNOSIS — Z9884 Bariatric surgery status: Secondary | ICD-10-CM

## 2020-05-13 NOTE — Progress Notes (Signed)
Dear Dr. [name],   Thank you for referring Cathy Yang to our clinic. The following note includes my evaluation and treatment recommendations.  Chief Complaint:   OBESITY Cathy Yang (MR# [id number]) is a 34 y.o. female who presents for evaluation and treatment of obesity and related comorbidities. Current BMI is Body mass index is 45.1 kg/m. Cathy Yang has been struggling with her weight for many years and has been unsuccessful in either losing weight, maintaining weight loss, or reaching her healthy weight goal.  Cathy Yang is currently in the action stage of change and ready to dedicate time achieving and maintaining a healthier weight. Cathy Yang is interested in becoming our patient and working on intensive lifestyle modifications including (but not limited to) diet and exercise for weight loss.  Cathy Yang lives with her fiance, Joette Catching, and her 37 year old son.  She eats out 2-3 times per week and drinks a lot of caloric beverages and skips meals.  Positive for emotional eating and a lot of snacking during the day, without eating a meal sometimes.  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  Her mother had a stroke in May, and Cathy Yang takes care of her.  Her bariatric surgeon has had her on phentermine for 2-3 months now.  She was sent by them for management of her obesity.  She thinks the best plan she did in the past was in 11/19 when her diet was directed by her bariatric surgeon.  She felt she was most successful with that.     [name]'s habits were reviewed today and are as follows: Her family eats meals together, she thinks her family will eat healthier with her, her desired weight loss is 80 pounds, she has been heavy most of her life, she started gaining weight after high school, her heaviest weight ever was 332 pounds, she craves rice and bread, she snacks frequently in the evenings, she skips breakfast frequently, she is frequently drinking liquids with calories, she frequently  makes poor food choices and she struggles with emotional eating.  Depression Screen Dazja's Food and Mood (modified PHQ-9) score was 5.  Depression screen [facility] 2/9 05/13/2020  Decreased Interest 0  Down, Depressed, Hopeless 1  PHQ - 2 Score 1  Altered sleeping 1  Tired, decreased energy 1  Change in appetite 2  Feeling bad or failure about yourself  0  Trouble concentrating 0  Moving slowly or fidgety/restless 0  Suicidal thoughts 0  PHQ-9 Score 5   Subjective:   1. Other fatigue Cathy Yang denies daytime somnolence and admits to waking up still tired. Patent has a history of symptoms of morning fatigue and morning headache. Cathy Yang generally gets 5 or 6 hours of sleep per night, and states that she has generally restful sleep. Snoring is not present. Apneic episodes are not present. Epworth Sleepiness Score is 2.  2. SOB (shortness of breath) on exertion Cathy Yang notes increasing shortness of breath with exercising and seems to be worsening over time with weight gain. She notes getting out of breath sooner with activity than she used to. This has not gotten worse recently. Cathy Yang denies shortness of breath at rest or orthopnea.  3. Essential hypertension Review: taking medications as instructed, no medication side effects noted, no chest pain on exertion, no dyspnea on exertion, no swelling of ankles.  Blood pressure is at goal.  BP Readings from Last 3 Encounters:  05/13/20 123/74  11/01/19 (!) 145/90  09/09/19 (!) 139/107   4. Other  hyperlipidemia Cathy Yang has hyperlipidemia and has been trying to improve her cholesterol levels with intensive lifestyle modification including a low saturated fat diet, exercise and weight loss. She denies any chest pain, claudication or myalgias.  Lab Results  Component Value Date   ALT 9 05/13/2020   AST 12 05/13/2020   ALKPHOS 104 05/13/2020   BILITOT 0.6 05/13/2020   5. Anemia due to other cause, not classified Cathy Yang is not a vegetarian.  She  has a history of weight loss surgery.   CBC Latest Ref Rng & Units 05/13/2020 10/31/2019 12/30/2018  WBC 3.4 - 10.8 x10E3/uL 5.7 5.9 4.9  Hemoglobin 11.1 - 15.9 g/dL 76.2 83.1 51.7  Hematocrit 34.0 - 46.6 % 38.7 44.0 45.7  Platelets 150 - 450 x10E3/uL 287 321 263   6. Lactose intolerance Shamera suffers from lactose intolerance.  7. Status post laparoscopic sleeve gastrectomy Cathy Yang had laparoscopic sleeve gastrectomy in 09/2017 with Dr. [name].  She has been on phentermine 15 mg daily for 2-3 months now.  She could not tolerate higher dose.  She was at 337 pounds prior to gastric sleeve.  8. Anxiety and depression, with emotional eating Cathy Yang is struggling with emotional eating and using food for comfort to the extent that it is negatively impacting her health. She has been working on behavior modification techniques to help reduce her emotional eating and has been unsuccessful. She shows no sign of suicidal or homicidal ideations.  Assessment/Plan:   1. Other fatigue Cathy Yang does feel that her weight is causing her energy to be lower than it should be. Fatigue may be related to obesity, depression or many other causes. Labs will be ordered, and in the meanwhile, Cathy Yang will focus on self care including making healthy food choices, increasing physical activity and focusing on stress reduction. - EKG 12-Lead - VITAMIN D 25 Hydroxy (Vit-D Deficiency, Fractures) - Vitamin B12 - Folate - T3 - T4, free - TSH  2. SOB (shortness of breath) on exertion Cathy Yang does not feel that she gets out of breath more easily that she used to when she exercises. Cathy Yang's shortness of breath appears to be obesity related and exercise induced. She has agreed to work on weight loss and gradually increase exercise to treat her exercise induced shortness of breath. Will continue to monitor closely. - VITAMIN D 25 Hydroxy (Vit-D Deficiency, Fractures) - Vitamin B12 - Folate - T3 - T4, free - TSH  3. Essential  hypertension Cathy Yang is working on healthy weight loss and exercise to improve blood pressure control. We will watch for signs of hypotension as she continues her lifestyle modifications.  4. Other hyperlipidemia Cardiovascular risk and specific lipid/LDL goals reviewed.  We discussed several lifestyle modifications today and Neelam will continue to work on diet, exercise and weight loss efforts. Orders and follow up as documented in patient record.   Counseling Intensive lifestyle modifications are the first line treatment for this issue.  Dietary changes: Increase soluble fiber. Decrease simple carbohydrates.  Exercise changes: Moderate to vigorous-intensity aerobic activity 150 minutes per week if tolerated.  Lipid-lowering medications: see documented in medical record. - Comprehensive metabolic panel - Hemoglobin A1c - Insulin, random - Lipid Panel With LDL/HDL Ratio  5. Anemia due to other cause, not classified Orders and follow up as documented in patient record.  Counseling  Iron is essential for our bodies to make red blood cells.  Reasons that someone may be deficient include: an iron-deficient diet (more likely in those following vegan or vegetarian  diets), women with heavy menses, patients with GI disorders or poor absorption, patients that have had bariatric surgery, frequent blood donors, patients with cancer, and patients with heart disease.    Iron-rich foods include dark leafy greens, red and white meats, eggs, seafood, and beans.    Certain foods and drinks prevent your body from absorbing iron properly. Avoid eating these foods in the same meal as iron-rich foods or with iron supplements. These foods include: coffee, black tea, and red wine; milk, dairy products, and foods that are high in calcium; beans and soybeans; whole grains.   Constipation can be a side effect of iron supplementation. Increased water and fiber intake are helpful. Water goal: > 2 liters/day. Fiber  goal: > 25 grams/day. - CBC with Differential/Platelet - Vitamin B12 - Folate  6. Lactose intolerance Will continue to monitor.  Will check labs today.  7. Status post laparoscopic sleeve gastrectomy Tamyrah is at risk for malnutrition due to her previous bariatric surgery.   Counseling  You may need to eat 3 meals and 2 snacks, or 5 small meals each day in order to reach your protein and calorie goals.   Allow at least 15 minutes for each meal so that you can eat mindfully. Listen to your body so that you do not overeat. For most people, your sleeve or pouch will comfortably hold 4-6 ounces.  Eat foods from all food groups. This includes fruits and vegetables, grains, dairy, and meat and other proteins.  Include a protein-rich food at every meal and snack, and eat the protein food first.   You should be taking a Bariatric Multivitamin as well as calcium.   8. Anxiety and depression, with emotional eating Patient was referred to Dr. Dewaine Conger, our Bariatric Psychologist, for evaluation due to her elevated PHQ-9 score and significant struggles with emotional eating.  9. Class 3 severe obesity with serious comorbidity and body mass index (BMI) of 45.0 to 49.9 in adult, unspecified obesity type (HCC) Caryl is currently in the action stage of change and her goal is to continue with weight loss efforts. I recommend Tylea begin the structured treatment plan as follows:  She has agreed to the Category 2 Plan.  Exercise goals: As is.   Behavioral modification strategies: increasing lean protein intake, decreasing liquid calories, no skipping meals, meal planning and cooking strategies and emotional eating strategies.  She was informed of the importance of frequent follow-up visits to maximize her success with intensive lifestyle modifications for her multiple health conditions. She was informed we would discuss her lab results at her next visit unless there is a critical issue that needs to  be addressed sooner. Paxton agreed to keep her next visit at the agreed upon time to discuss these results.  Objective:   Blood pressure 123/74, pulse 86, temperature 98.3 F (36.8 C), temperature source Oral, height 5\' 5"  (1.651 m), weight 271 lb (122.9 kg), last menstrual period 04/14/2020, SpO2 100 %. Body mass index is 45.1 kg/m.  EKG: Normal sinus rhythm, rate 88 bpm.  Indirect Calorimeter completed today shows a VO2 of 235 and a REE of 1631.  Her calculated basal metabolic rate is 04/16/2020 thus her basal metabolic rate is worse than expected.  General: Cooperative, alert, well developed, in no acute distress. HEENT: Conjunctivae and lids unremarkable. Cardiovascular: Regular rhythm.  Lungs: Normal work of breathing. Neurologic: No focal deficits.   Lab Results  Component Value Date   CREATININE 0.62 05/13/2020   BUN 7 05/13/2020  NA 138 05/13/2020   K 4.1 05/13/2020   CL 101 05/13/2020   CO2 26 05/13/2020   Lab Results  Component Value Date   ALT 9 05/13/2020   AST 12 05/13/2020   ALKPHOS 104 05/13/2020   BILITOT 0.6 05/13/2020   Lab Results  Component Value Date   WBC 5.7 05/13/2020   HGB 12.9 05/13/2020   HCT 38.7 05/13/2020   MCV 93 05/13/2020   PLT 287 05/13/2020   Attestation Statements:   Reviewed by clinician on day of visit: allergies, medications, problem list, medical history, surgical history, family history, social history, and previous encounter notes.  Time spent on visit including pre-visit chart review and post-visit charting and care was 58 minutes.   I, Insurance claims handlerAmber Agner, CMA, am acting as Energy managertranscriptionist for Marsh & McLennanDeborah Charles Andringa, DO.  I have reviewed the above documentation for accuracy and completeness, and I agree with the above. Thomasene Lot- Tameka Hoiland, DO

## 2020-05-14 LAB — CBC WITH DIFFERENTIAL/PLATELET
Basophils Absolute: 0.1 10*3/uL (ref 0.0–0.2)
Basos: 1 %
EOS (ABSOLUTE): 0.2 10*3/uL (ref 0.0–0.4)
Eos: 3 %
Hematocrit: 38.7 % (ref 34.0–46.6)
Hemoglobin: 12.9 g/dL (ref 11.1–15.9)
Immature Grans (Abs): 0 10*3/uL (ref 0.0–0.1)
Immature Granulocytes: 0 %
Lymphocytes Absolute: 2.3 10*3/uL (ref 0.7–3.1)
Lymphs: 41 %
MCH: 30.9 pg (ref 26.6–33.0)
MCHC: 33.3 g/dL (ref 31.5–35.7)
MCV: 93 fL (ref 79–97)
Monocytes Absolute: 0.5 10*3/uL (ref 0.1–0.9)
Monocytes: 9 %
Neutrophils Absolute: 2.6 10*3/uL (ref 1.4–7.0)
Neutrophils: 46 %
Platelets: 287 10*3/uL (ref 150–450)
RBC: 4.18 x10E6/uL (ref 3.77–5.28)
RDW: 14 % (ref 11.7–15.4)
WBC: 5.7 10*3/uL (ref 3.4–10.8)

## 2020-05-14 LAB — COMPREHENSIVE METABOLIC PANEL
ALT: 9 IU/L (ref 0–32)
AST: 12 IU/L (ref 0–40)
Albumin/Globulin Ratio: 1.3 (ref 1.2–2.2)
Albumin: 3.8 g/dL (ref 3.8–4.8)
Alkaline Phosphatase: 104 IU/L (ref 48–121)
BUN/Creatinine Ratio: 11 (ref 9–23)
BUN: 7 mg/dL (ref 6–20)
Bilirubin Total: 0.6 mg/dL (ref 0.0–1.2)
CO2: 26 mmol/L (ref 20–29)
Calcium: 9 mg/dL (ref 8.7–10.2)
Chloride: 101 mmol/L (ref 96–106)
Creatinine, Ser: 0.62 mg/dL (ref 0.57–1.00)
GFR calc Af Amer: 138 mL/min/{1.73_m2} (ref >59)
GFR calc non Af Amer: 120 mL/min/{1.73_m2} (ref >59)
Globulin, Total: 3 g/dL (ref 1.5–4.5)
Glucose: 76 mg/dL (ref 65–99)
Potassium: 4.1 mmol/L (ref 3.5–5.2)
Sodium: 138 mmol/L (ref 134–144)
Total Protein: 6.8 g/dL (ref 6.0–8.5)

## 2020-05-14 LAB — HEMOGLOBIN A1C
Est. average glucose Bld gHb Est-mCnc: 105 mg/dL
Hgb A1c MFr Bld: 5.3 % (ref 4.8–5.6)

## 2020-05-14 LAB — VITAMIN D 25 HYDROXY (VIT D DEFICIENCY, FRACTURES): Vit D, 25-Hydroxy: 30.7 ng/mL (ref 30.0–100.0)

## 2020-05-14 LAB — LIPID PANEL WITH LDL/HDL RATIO
Cholesterol, Total: 178 mg/dL (ref 100–199)
HDL: 55 mg/dL (ref >39)
LDL Chol Calc (NIH): 112 mg/dL — ABNORMAL HIGH (ref 0–99)
LDL/HDL Ratio: 2 ratio (ref 0.0–3.2)
Triglycerides: 58 mg/dL (ref 0–149)
VLDL Cholesterol Cal: 11 mg/dL (ref 5–40)

## 2020-05-14 LAB — T4, FREE: Free T4: 1.13 ng/dL (ref 0.82–1.77)

## 2020-05-14 LAB — TSH: TSH: 1.34 u[IU]/mL (ref 0.450–4.500)

## 2020-05-14 LAB — FOLATE: Folate: 11.1 ng/mL (ref >3.0)

## 2020-05-14 LAB — VITAMIN B12: Vitamin B-12: 524 pg/mL (ref 232–1245)

## 2020-05-14 LAB — INSULIN, RANDOM: INSULIN: 6.6 u[IU]/mL (ref 2.6–24.9)

## 2020-05-14 LAB — T3: T3, Total: 144 ng/dL (ref 71–180)

## 2020-05-18 NOTE — Progress Notes (Signed)
Office: [phone]  /  Fax: [phone]    Date: June 01, 2020   Appointment Start Time: 2:57pm Duration: 43 minutes Provider: Lawerance Cruel, Psy.D. Type of Session: Intake for Individual Therapy  Location of Patient: Home Location of Provider: [facility] Type of Contact: Telepsychological Visit via MyChart Video Visit  Informed Consent: Prior to proceeding with today's appointment, two pieces of identifying information were obtained. In addition, Nesreen's physical location at the time of this appointment was obtained as well a phone number she could be reached at in the event of technical difficulties. Kylei and this provider participated in today's telepsychological service.   The provider's role was explained to [employer]. The provider reviewed and discussed issues of confidentiality, privacy, and limits therein (e.g., reporting obligations). In addition to verbal informed consent, written informed consent for psychological services was obtained prior to the initial appointment. Since the clinic is not a 24/7 crisis center, mental health emergency resources were shared and this  provider explained MyChart, e-mail, voicemail, and/or other messaging systems should be utilized only for non-emergency reasons. This provider also explained that information obtained during appointments will be placed in Aailyah's medical record and relevant information will be shared with other providers at [facility] for coordination of care. Moreover, Davine agreed information may be shared with other [facility] providers as needed for coordination of care. By signing the service agreement document, Blanka provided written consent for coordination of care. Prior to initiating telepsychological services, Yamaris completed an informed consent document, which included the development of a safety plan (i.e., an emergency contact, nearest emergency room, and  emergency resources) in the event of an emergency/crisis. Jeanine expressed understanding of the rationale of the safety plan. Novali verbally acknowledged understanding she is ultimately responsible for understanding her insurance benefits for telepsychological and in-person services. This provider also reviewed confidentiality, as it relates to telepsychological services, as well as the rationale for telepsychological services (i.e., to reduce exposure risk to COVID-19). Malaijah  acknowledged understanding that appointments cannot be recorded without both party consent and she is aware she is responsible for securing confidentiality on her end of the session. Adriella verbally consented to proceed.  Chief Complaint/HPI: Prachi was referred by Dr. Thomasene Lot due to anxiety and depression, with emotional eating. Per the note for the initial visit with Dr. Thomasene Lot on May 13, 2020, "Jadia is struggling with emotional eating and using food for comfort to the extent that it is negatively impacting her health. She has been working on behavior modification techniques to help reduce her emotional eating and has been unsuccessful. She shows no sign of suicidal or homicidal ideations." The note for the initial appointment with Dr. Thomasene Lot indicated the following: "Bridey's habits were reviewed today and are as follows: Her family eats meals together, she thinks her family will eat healthier with her, her desired weight loss is 80 pounds, she has been heavy most of her life, she started gaining weight after high school, her heaviest weight ever was 332 pounds, she craves rice and bread, she snacks frequently in the evenings, she skips breakfast frequently, she is frequently drinking liquids with calories, she frequently makes poor food choices and she struggles with emotional eating." Shyann's Food and Mood (modified PHQ-9) score on May 13, 2020 was 5.  During today's appointment, Samoria was verbally  administered a questionnaire assessing various behaviors related to emotional eating. Tonnie endorsed the following: eat certain foods when you  are anxious, stressed, depressed, or your feelings are hurt, use food to help you cope with emotional situations, find food is comforting to you, overeat frequently when you are bored or lonely, overeat when you are alone, but eat much less when you are with other people and eat as a reward. Alexsandria believes the onset of emotional eating was likely in childhood, noting an exacerbation in May 2020 when her grandmother passed away. She described the current frequency of emotional eating as "few times a month." In addition, Tihanna denied a history of binge eating. Jocelin denied a history of restricting food intake, purging and engagement in other compensatory strategies, and has never been diagnosed with an eating disorder. She also denied a history of treatment for emotional eating. Moreover, Katalea indicated "life" and "family" situations triggers emotional eating, whereas engaging in meditation, stepping away, and focusing on self-care makes emotional eating better. She also discussed a history of skipping meals and currently experiencing decreased appetite. She believes the aforementioned is secondary to worry about her mother's well-being and being on the go.   Mental Status Examination:  Appearance: well groomed and appropriate hygiene  Behavior: appropriate to circumstances Mood: euthymic Affect: mood congruent Speech: normal in rate, volume, and tone Eye Contact: appropriate Psychomotor Activity: appropriate Gait: unable to assess Thought Process: linear, logical, and goal directed  Thought Content/Perception: denies suicidal and homicidal ideation, plan, and intent and no hallucinations, delusions, bizarre thinking or behavior reported or observed Orientation: time, person, place, and purpose of appointment Memory/Concentration: memory, attention,  language, and fund of knowledge intact  Insight/Judgment: fair  Family & Psychosocial History: Barrett ShellCanvis reported she is engaged and she has one son (age 33). 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 Currently, Nabilah's social support system consists of her fiance, son, mother, and two close cousins. Moreover, Jayel stated she resides with her fiance and son.   Medical History:  Past Medical History:  Diagnosis Date  . Anemia   . Anxiety   . Back pain   . Chronic cholecystitis   . Depression   . High cholesterol   . Hypertension   . Lactose intolerance   . Nausea & vomiting    PAIN ALSO WITH GALLBALDDER PROBLEM LAST 3 MONTHS  . Scoliosis   . Vitamin D deficiency    Past Surgical History:  Procedure Laterality Date  . CESAREAN SECTION  01/25/2010   X1  . CHOLECYSTECTOMY N/A 07/10/2018   Procedure: LAPAROSCOPIC CHOLECYSTECTOMY WITH INTRAOPERATIVE CHOLANGIOGRAM;  Surgeon: Luretha MurphyMartin, Matthew, MD;  Location: [facility];  Service: General;  Laterality: N/A;  . FIXATION KYPHOPLASTY  AGE 70  . LAPAROSCOPIC GASTRIC SLEEVE RESECTION N/A 10/08/2017   Procedure: LAPAROSCOPIC GASTRIC SLEEVE RESECTION WITH UPPER ENDOSCOPY;  Surgeon: Luretha MurphyMartin, Matthew, MD;  Location: [facility];  Service: General;  Laterality: N/A;   Current Outpatient Medications on File Prior to Visit  Medication Sig Dispense Refill  . busPIRone (BUSPAR) 7.5 MG tablet Take 7.5 mg by mouth daily.     Marland Kitchen. lamoTRIgine (LAMICTAL) 25 MG tablet Take 25 mg by mouth daily.    . phentermine 15 MG capsule Take 15 mg by mouth daily.    . Vitamin D, Ergocalciferol, (DRISDOL) 1.25 MG (50000 UNIT) CAPS capsule Take 50,000 Units by mouth once a week.    . Vitamin D, Ergocalciferol, (DRISDOL) 1.25 MG (50000 UNIT) CAPS capsule Take one tablet every Sunday and take one tablet every Wednesday. 8 capsule 0   No current  facility-administered medications on file prior to visit.   Mental Health History: Adelayde  reported she attended mental health services to complete bariatric surgery around 2018. Santia reported she is not taking Lamictal and Buspar, which were prescribed by her PCP. She was encouraged to inform her PCP that she discontinued the medications; she agreed. Cynthis noted she informed Dr. Sharee Holster that she discontinued the medications. Joleah further shared she was previously diagnosed with depression and anxiety. Shermeka reported there is no history of hospitalizations for psychiatric concerns. Lorna denied a family history of mental health related concerns. Devri reported there is no history of trauma including psychological, physical  and sexual abuse, as well as neglect.   Karleigh described her typical mood lately as "good," noting it has improved in the past month. She believes the aforementioned is secondary to improvement in her mother's well-being. She also stated she discontinued phentermine, which she feels was contributing to irritability. Aside from concerns noted above, Dorea reported a decrease in self-care since her mother got sick in September 2020. Mayo denied current alcohol use. She denied tobacco use. She denied illicit/recreational substance use. Regarding caffeine intake, Nate reported consuming one cup of coffee daily. Furthermore, Toinette indicated she is not experiencing the following: hallucinations and delusions, paranoia, symptoms of mania , social withdrawal, crying spells, panic attacks and decreased motivation. She also denied history of and current suicidal ideation, plan, and intent; history of and current homicidal ideation, plan, and intent; and history of and current engagement in self-harm.  The following strengths were reported by Shalita: good listener, good care taker, great mom, and great fiance. The following strengths were observed by this provider: ability to express thoughts and feelings during the therapeutic session, ability to establish and benefit from a  therapeutic relationship, willingness to work toward established goal(s) with the clinic and ability to engage in reciprocal conversation.   Legal History: Malyah reported there is no history of legal involvement.   Structured Assessments Results: The Patient Health Questionnaire-9 (PHQ-9) is a self-report measure that assesses symptoms and severity of depression over the course of the last two weeks. Marit obtained a score of 3 suggesting minimal depression. Ellyse finds the endorsed symptoms to be somewhat difficult. [0= Not at all; 1= Several days; 2= More than half the days; 3= Nearly every day] Little interest or pleasure in doing things 0  Feeling down, depressed, or hopeless 0  Trouble falling or staying asleep, or sleeping too much 0  Feeling tired or having little energy 0  Poor appetite or overeating 3  Feeling bad about yourself --- or that you are a failure or have let yourself or your family down 0  Trouble concentrating on things, such as reading the newspaper or watching television 0  Moving or speaking so slowly that other people could have noticed? Or the opposite --- being so fidgety or restless that you have been moving around a lot more than usual 0  Thoughts that you would be better off dead or hurting yourself in some way 0  PHQ-9 Score 3    The Generalized Anxiety Disorder-7 (GAD-7) is a brief self-report measure that assesses symptoms of anxiety over the course of the last two weeks. Gini obtained a score of 0. [0= Not at all; 1= Several days; 2= Over half the days; 3= Nearly every day] Feeling nervous, anxious, on edge 0  Not being able to stop or control worrying 0  Worrying too much about different things 0  Trouble  relaxing 0  Being so restless that it's hard to sit still 0  Becoming easily annoyed or irritable 0  Feeling afraid as if something awful might happen 0  GAD-7 Score 0   Interventions:  Conducted a chart review Focused on rapport  building Verbally administered PHQ-9 and GAD-7 for symptom monitoring Verbally administered Food & Mood questionnaire to assess various behaviors related to emotional eating Provided emphatic reflections and validation Collaborated with patient on a treatment goal  Psychoeducation provided regarding self-care  Psychoeducation provided regarding pleasurable activities   Provisional DSM-5 Diagnosis(es): 307.59 (F50.8) Other Specified Feeding or Eating Disorder, Emotional Eating Behaviors  Plan: Cole appears able and willing to participate as evidenced by collaboration on a treatment goal, engagement in reciprocal conversation, and asking questions as needed for clarification. The next appointment will be scheduled in approximately two weeks, which will be via MyChart Video Visit. The following treatment goal was established: increase coping skills. This provider will regularly review the treatment plan and medical chart to keep informed of status changes. Setareh expressed understanding and agreement with the initial treatment plan of care.   Psychoeducation regarding the importance of self-care utilizing the oxygen mask metaphor was provided. Additionally, psychoeducation regarding pleasurable activities, including its impact on emotional eating and overall well-being was provided. Jackqulyn was provided with a handout with various options of pleasurable activities, and was encouraged to engage in one activity a day and additional activities as needed when triggered to emotionally eat. Itxel agreed. Cindia provided verbal consent during today's appointment for this provider to send a handout with pleasurable activities via e-mail.

## 2020-05-27 ENCOUNTER — Ambulatory Visit (INDEPENDENT_AMBULATORY_CARE_PROVIDER_SITE_OTHER): Payer: Medicaid Other | Admitting: Family Medicine

## 2020-05-27 ENCOUNTER — Encounter (INDEPENDENT_AMBULATORY_CARE_PROVIDER_SITE_OTHER): Payer: Self-pay | Admitting: Family Medicine

## 2020-05-27 ENCOUNTER — Other Ambulatory Visit: Payer: Self-pay

## 2020-05-27 VITALS — BP 122/74 | HR 97 | Temp 97.6°F | Ht 65.0 in | Wt 268.0 lb

## 2020-05-27 DIAGNOSIS — E8881 Metabolic syndrome: Secondary | ICD-10-CM | POA: Diagnosis not present

## 2020-05-27 DIAGNOSIS — F32A Depression, unspecified: Secondary | ICD-10-CM

## 2020-05-27 DIAGNOSIS — E7849 Other hyperlipidemia: Secondary | ICD-10-CM

## 2020-05-27 DIAGNOSIS — Z6841 Body Mass Index (BMI) 40.0 and over, adult: Secondary | ICD-10-CM

## 2020-05-27 DIAGNOSIS — F419 Anxiety disorder, unspecified: Secondary | ICD-10-CM

## 2020-05-27 DIAGNOSIS — Z9884 Bariatric surgery status: Secondary | ICD-10-CM

## 2020-05-27 DIAGNOSIS — E66813 Obesity, class 3: Secondary | ICD-10-CM

## 2020-05-27 DIAGNOSIS — E559 Vitamin D deficiency, unspecified: Secondary | ICD-10-CM

## 2020-05-27 DIAGNOSIS — F329 Major depressive disorder, single episode, unspecified: Secondary | ICD-10-CM | POA: Diagnosis not present

## 2020-05-27 DIAGNOSIS — E88819 Insulin resistance, unspecified: Secondary | ICD-10-CM

## 2020-05-27 DIAGNOSIS — Z8679 Personal history of other diseases of the circulatory system: Secondary | ICD-10-CM | POA: Diagnosis not present

## 2020-05-27 MED ORDER — VITAMIN D (ERGOCALCIFEROL) 1.25 MG (50000 UNIT) PO CAPS: ORAL_CAPSULE | ORAL | 0 refills | Status: DC

## 2020-05-31 NOTE — Progress Notes (Addendum)
Chief Complaint:   OBESITY Cathy Yang is here to discuss her progress with her obesity treatment plan along with follow-up of her obesity related diagnoses. Cathy Yang is on the Category 2 Plan and states she is following her eating plan approximately 75% of the time. Cathy Yang states she is exercising for 0 minutes 0 times per week.  Today's visit was #: 2 Starting weight: 271 lbs Starting date: 05/13/2020 Today's weight: 268 lbs Today's date: 05/27/2020 Total lbs lost to date: 3 lbs Total lbs lost since last in-office visit: 3 lbs  Interim History: Cathy Yang says she is still having days where she is not eating breakfast at all and for 2 or 3 days she did not eat anything at all, but drank on those days.  She had 2 days where she ate the plan the whole day.  She says her hunger is well-controlled; doesn't feel hungry.  She craves sugar, but she does not like it normally and felt it was likely psychological.  Subjective:   1. Other hyperlipidemia Cathy Yang has hyperlipidemia and has been trying to improve her cholesterol levels with intensive lifestyle modification including a low saturated fat diet, exercise and weight loss. She denies any chest pain, claudication or myalgias.    Lab Results  Component Value Date   ALT 9 05/13/2020   AST 12 05/13/2020   ALKPHOS 104 05/13/2020   BILITOT 0.6 05/13/2020   Lab Results  Component Value Date   CHOL 178 05/13/2020   HDL 55 05/13/2020   LDLCALC 112 (H) 05/13/2020   TRIG 58 05/13/2020   2. Insulin resistance Cathy Yang has a diagnosis of insulin resistance based on her elevated fasting insulin level >5. She continues to work on diet and exercise to decrease her risk of diabetes.  A1c normal.  Lab Results  Component Value Date   INSULIN 6.6 05/13/2020   Lab Results  Component Value Date   HGBA1C 5.3 05/13/2020   3. Vitamin D deficiency Cathy Yang's Vitamin D level was 30.7 on 05/13/2020. She is currently taking prescription vitamin D 50,000 IU each  week. She denies nausea, vomiting or muscle weakness.  She is skipping doses rarely.  4. History of hypertension Well-controlled since surgery.  No medications.  No symptoms.  BP Readings from Last 3 Encounters:  05/27/20 122/74  05/13/20 123/74  11/01/19 (!) 145/90   5. S/P laparoscopic sleeve gastrectomy No issues or concerns currently.  She says she is tolerating foods well.  6. Anxiety and depression, with emotional eating Dr. Hal Hope, her PCP, started her on Buspar and Lamictal as well as phentermine many months ago dueo to increased stress due to COVID, etc.  After her last office visit, she realized she felt worse emotionally taking those medications, especially phentermine as she was lashing out and more irritable.  Assessment/Plan:   1. Other hyperlipidemia Discussed labs with patient today.  Cardiovascular risk and specific lipid/LDL goals reviewed.  We discussed several lifestyle modifications today and [name] will continue to work on diet, exercise and weight loss efforts. Orders and follow up as documented in patient record.  Continue prudent nutritional plan, weight loss, decrease saturated/trans fats.  Only lean meats per meal plan.  Counseling Intensive lifestyle modifications are the first line treatment for this issue. . Dietary changes: Increase soluble fiber. Decrease simple carbohydrates. . Exercise changes: Moderate to vigorous-intensity aerobic activity 150 minutes per week if tolerated. . Lipid-lowering medications: see documented in medical record.  2. Insulin resistance Cathy Yang will continue to work  on weight loss, exercise, and decreasing simple carbohydrates to help decrease the risk of diabetes. Cathy Yang agreed to follow-up with [name] as directed to closely monitor her progress.  Educated and handouts given.  Decrease simple carbs, eat complex carbs, and increase protein per meal plan and weight loss.  3. Vitamin D deficiency Low Vitamin D level contributes to  fatigue and are associated with obesity, breast, and colon cancer. She agrees to start to take prescription Vitamin D @50 ,000 IU twice a week and will follow-up for routine testing of Vitamin D, at least 2-3 times per year to avoid over-replacement.  Will recheck level in 3 months.  Continue prudent nutritional plan and weight loss. - Vitamin D, Ergocalciferol, (DRISDOL) 1.25 MG (50000 UNIT) CAPS capsule; Take one tablet every Sunday and take one tablet every Wednesday.  Dispense: 8 capsule; Refill: 0  4. History of hypertension Discussed labs with patient today. Cathy Yang is working on healthy weight loss and exercise to improve blood pressure control. We will watch for signs of hypotension as she continues her lifestyle modifications.  At goal today at 122/74.  Continue prudent nutritional plan, weight loss, low salt diet per meal plan.  5. S/P laparoscopic sleeve gastrectomy Cathy Yang is at risk for malnutrition due to her previous bariatric surgery.  Continue prudent nutritional plan and weight loss.   Counseling  You may need to eat 3 meals and 2 snacks, or 5 small meals each day in order to reach your protein and calorie goals.   Allow at least 15 minutes for each meal so that you can eat mindfully. Listen to your body so that you do not overeat. For most people, your sleeve or pouch will comfortably hold 4-6 ounces.  Eat foods from all food groups. This includes fruits and vegetables, grains, dairy, and meat and other proteins.  Include a protein-rich food at every meal and snack, and eat the protein food first.   You should be taking a Bariatric Multivitamin as well as calcium.   6. Anxiety and depression, with emotional eating Behavior modification techniques were discussed today to help Cathy Yang deal with her emotional/non-hunger eating behaviors.  Orders and follow up as documented in patient record.  Emotionally doing well and declines medications.  Follow-up with PCP and we discussed that  she already does not eat and phentermine is not appropriate for her.  7. Class 3 severe obesity with serious comorbidity and body mass index (BMI) of 40.0 to 44.9 in adult, unspecified obesity type (HCC) Estephanie is currently in the action stage of change. As such, her goal is to continue with weight loss efforts. She has agreed to the Category 2 Plan.   Exercise goals: As is.  Behavioral modification strategies: increasing lean protein intake, decreasing simple carbohydrates, no skipping meals, meal planning and cooking strategies and planning for success.  Nour has agreed to follow-up with our clinic in 2 weeks. She was informed of the importance of frequent follow-up visits to maximize her success with intensive lifestyle modifications for her multiple health conditions.   Objective:   Blood pressure 122/74, pulse 97, temperature 97.6 F (36.4 C), height 5\' 5"  (1.651 m), weight 268 lb (121.6 kg), last menstrual period 05/16/2020, SpO2 100 %. Body mass index is 44.6 kg/m.  General: Cooperative, alert, well developed, in no acute distress. HEENT: Conjunctivae and lids unremarkable. Cardiovascular: Regular rhythm.  Lungs: Normal work of breathing. Neurologic: No focal deficits.   Lab Results  Component Value Date   CREATININE 0.62 05/13/2020  BUN 7 05/13/2020   NA 138 05/13/2020   K 4.1 05/13/2020   CL 101 05/13/2020   CO2 26 05/13/2020   Lab Results  Component Value Date   ALT 9 05/13/2020   AST 12 05/13/2020   ALKPHOS 104 05/13/2020   BILITOT 0.6 05/13/2020   Lab Results  Component Value Date   HGBA1C 5.3 05/13/2020   Lab Results  Component Value Date   INSULIN 6.6 05/13/2020   Lab Results  Component Value Date   TSH 1.340 05/13/2020   Lab Results  Component Value Date   CHOL 178 05/13/2020   HDL 55 05/13/2020   LDLCALC 112 (H) 05/13/2020   TRIG 58 05/13/2020   Lab Results  Component Value Date   WBC 5.7 05/13/2020   HGB 12.9 05/13/2020   HCT 38.7  05/13/2020   MCV 93 05/13/2020   PLT 287 05/13/2020   Attestation Statements:   Reviewed by clinician on day of visit: allergies, medications, problem list, medical history, surgical history, family history, social history, and previous encounter notes.  Time spent on visit including pre-visit chart review and post-visit care and charting was 55 minutes.   I, Insurance claims handler, CMA, am acting as Energy manager for [employer], DO.  I have reviewed the above documentation for accuracy and completeness, and I agree with the above. Thomasene Lot, DO

## 2020-06-01 ENCOUNTER — Telehealth (INDEPENDENT_AMBULATORY_CARE_PROVIDER_SITE_OTHER): Payer: Medicaid Other | Admitting: Psychology

## 2020-06-01 ENCOUNTER — Other Ambulatory Visit: Payer: Self-pay

## 2020-06-01 DIAGNOSIS — F5089 Other specified eating disorder: Secondary | ICD-10-CM

## 2020-06-02 DIAGNOSIS — Z903 Acquired absence of stomach [part of]: Secondary | ICD-10-CM | POA: Diagnosis not present

## 2020-06-02 NOTE — Progress Notes (Unsigned)
Office: [phone]  /  Fax: [phone]    Date: June 16, 2020   Appointment Start Time: *** Duration: *** minutes Provider: Lawerance Cruel, Psy.D. Type of Session: Individual Therapy  Location of Patient: {gbptloc:23249} Location of Provider: Provider's Home Type of Contact: Telepsychological Visit via MyChart Video Visit  Session Content: Cathy Yang is a 34 y.o. female presenting for a follow-up appointment to address the previously established treatment goal of increasing coping skills. Today's appointment was a telepsychological visit due to COVID-19. Cathy Yang provided verbal consent for today's telepsychological appointment and she is aware she is responsible for securing confidentiality on her end of the session. Prior to proceeding with today's appointment, Cathy Yang's physical location at the time of this appointment was obtained as well a phone number she could be reached at in the event of technical difficulties. Cathy Yang and this provider participated in today's telepsychological service.   This provider conducted a brief check-in and verbally administered the PHQ-9 and GAD-7. *** Cathy Yang was receptive to today's appointment as evidenced by openness to sharing, responsiveness to feedback, and {gbreceptiveness:23401}.  Mental Status Examination:  Appearance: {Appearance:22431} Behavior: {Behavior:22445} Mood: {gbmood:21757} Affect: {Affect:22436} Speech: {Speech:22432} Eye Contact: {Eye Contact:22433} Psychomotor Activity: {Motor Activity:22434} Gait: {gbgait:23404} Thought Process: {thought process:22448}  Thought Content/Perception: {disturbances:22451} Orientation: {Orientation:22437} Memory/Concentration: {gbcognition:22449} Insight/Judgment: {Insight:22446}  Structured Assessments Results: The Patient Health Questionnaire-9 (PHQ-9) is a self-report measure that assesses symptoms and severity of depression over the course of the last two weeks. Cathy Yang obtained a score of ***  suggesting {GBPHQ9SEVERITY:21752}. Cathy Yang finds the endorsed symptoms to be {gbphq9difficulty:21754}. [0= Not at all; 1= Several days; 2= More than half the days; 3= Nearly every day] Little interest or pleasure in doing things ***  Feeling down, depressed, or hopeless ***  Trouble falling or staying asleep, or sleeping too much ***  Feeling tired or having little energy ***  Poor appetite or overeating ***  Feeling bad about yourself --- or that you are a failure or have let yourself or your family down ***  Trouble concentrating on things, such as reading the newspaper or watching television ***  Moving or speaking so slowly that other people could have noticed? Or the opposite --- being so fidgety or restless that you have been moving around a lot more than usual ***  Thoughts that you would be better off dead or hurting yourself in some way ***  PHQ-9 Score ***    The Generalized Anxiety Disorder-7 (GAD-7) is a brief self-report measure that assesses symptoms of anxiety over the course of the last two weeks. Cathy Yang obtained a score of *** suggesting {gbgad7severity:21753}. Cathy Yang finds the endorsed symptoms to be {gbphq9difficulty:21754}. [0= Not at all; 1= Several days; 2= Over half the days; 3= Nearly every day] Feeling nervous, anxious, on edge ***  Not being able to stop or control worrying ***  Worrying too much about different things ***  Trouble relaxing ***  Being so restless that it's hard to sit still ***  Becoming easily annoyed or irritable ***  Feeling afraid as if something awful might happen ***  GAD-7 Score ***   Interventions:  {Interventions for Progress Notes:23405}  DSM-5 Diagnosis(es): 307.59 (F50.8) Other Specified Feeding or Eating Disorder, Emotional Eating Behaviors  Treatment Goal & Progress: During the initial appointment with this provider, the following treatment goal was established: increase coping skills. Cathy Yang has demonstrated progress in her goal as  evidenced by {gbtxprogress:22839}. Cathy Yang also {gbtxprogress2:22951}.  Plan: The next appointment will be scheduled in {gbweeks:21758}, which  will be {gbtxmodality:23402}. The next session will focus on {Plan for Next Appointment:23400}.

## 2020-06-10 ENCOUNTER — Ambulatory Visit (INDEPENDENT_AMBULATORY_CARE_PROVIDER_SITE_OTHER): Payer: Medicaid Other | Admitting: Family Medicine

## 2020-06-10 ENCOUNTER — Other Ambulatory Visit: Payer: Self-pay

## 2020-06-10 ENCOUNTER — Encounter (INDEPENDENT_AMBULATORY_CARE_PROVIDER_SITE_OTHER): Payer: Self-pay | Admitting: Family Medicine

## 2020-06-10 VITALS — BP 138/84 | HR 104 | Temp 98.3°F | Ht 65.0 in | Wt 264.0 lb

## 2020-06-10 DIAGNOSIS — E559 Vitamin D deficiency, unspecified: Secondary | ICD-10-CM | POA: Diagnosis not present

## 2020-06-10 DIAGNOSIS — E8881 Metabolic syndrome: Secondary | ICD-10-CM | POA: Diagnosis not present

## 2020-06-10 DIAGNOSIS — Z9189 Other specified personal risk factors, not elsewhere classified: Secondary | ICD-10-CM

## 2020-06-10 DIAGNOSIS — Z6841 Body Mass Index (BMI) 40.0 and over, adult: Secondary | ICD-10-CM | POA: Diagnosis not present

## 2020-06-10 MED ORDER — VITAMIN D (ERGOCALCIFEROL) 1.25 MG (50000 UNIT) PO CAPS: ORAL_CAPSULE | ORAL | 0 refills | Status: DC

## 2020-06-14 NOTE — Progress Notes (Signed)
Chief Complaint:   OBESITY Cathy Yang is here to discuss her progress with her obesity treatment plan along with follow-up of her obesity related diagnoses. Cathy Yang is on the Category 2 Plan and states she is following her eating plan approximately 85% of the time. Cathy Yang states she is doing water aerobics for 60 minutes 4 times per week.  Today's visit was #: 3 Starting weight: 271 lbs Starting date: 05/13/2020 Today's weight: 264 lbs Today's date: 06/10/2020 Total lbs lost to date: 7 lbs Total lbs lost since last in-office visit: 4 lbs  Interim History: Cathy Yang says she really tried to eat an egg every morning.  She was skipping breakfast altogether before and not eating until 6 pm.  For lunch, she is now having peanut butter crackers (one package of nabs) between 11-2 pm.  She is with her mom and son and is caring for them.  She says she does not eat because she is not hungry.  Subjective:   1. Insulin resistance Cathy Yang has a diagnosis of insulin resistance based on her elevated fasting insulin level >5. She continues to work on diet and exercise to decrease her risk of diabetes.  Lab Results  Component Value Date   INSULIN 6.6 05/13/2020   Lab Results  Component Value Date   HGBA1C 5.3 05/13/2020   2. Vitamin D deficiency Cathy Yang's Vitamin D level was 30.7 on 05/13/2020. She is currently taking prescription vitamin D 50,000 IU each week. She denies nausea, vomiting or muscle weakness.    3. At risk for malnutrition Cathy Yang is at increased risk for malnutrition due to skipping meals and only eating once per day.  Assessment/Plan:   1. Insulin resistance Cathy Yang will continue to work on weight loss, exercise, and decreasing simple carbohydrates to help decrease the risk of diabetes. Cathy Yang agreed to follow-up with [name] as directed to closely monitor her progress.  2. Vitamin D deficiency Low Vitamin D level contributes to fatigue and are associated with obesity, breast, and colon  cancer. She agrees to continue to take prescription Vitamin D @50 ,000 IU every week and will follow-up for routine testing of Vitamin D, at least 2-3 times per year to avoid over-replacement.  Recheck labs in 3 months.  Continue prudent nutritional plan and weight loss.  - Refill Vitamin D, Ergocalciferol, (DRISDOL) 1.25 MG (50000 UNIT) CAPS capsule; Take one tablet every Sunday and take one tablet every Wednesday.  Dispense: 8 capsule; Refill: 0  3. At risk for malnutrition Cathy Yang was given approximately 15 minutes of counseling today regarding prevention of malnutrition and ways to meet macronutrient goals..   4. Class 3 severe obesity with serious comorbidity and body mass index (BMI) of 40.0 to 44.9 in adult, unspecified obesity type (HCC) Cathy Yang is currently in the action stage of change. As such, her goal is to continue with weight loss efforts. She has agreed to the Category 2 Plan.   Exercise goals: As is.  Behavioral modification strategies: increasing lean protein intake, decreasing simple carbohydrates and no skipping meals.  Cathy Yang has agreed to follow-up with our clinic in 2 weeks. She was informed of the importance of frequent follow-up visits to maximize her success with intensive lifestyle modifications for her multiple health conditions.   Objective:   Blood pressure 138/84, pulse (!) 104, temperature 98.3 F (36.8 C), height 5\' 5"  (1.651 m), weight 264 lb (119.7 kg), last menstrual period 05/16/2020, SpO2 98 %. Body mass index is 43.93 kg/m.  General: Cooperative, alert, well  developed, in no acute distress. HEENT: Conjunctivae and lids unremarkable. Cardiovascular: Regular rhythm.  Lungs: Normal work of breathing. Neurologic: No focal deficits.   Lab Results  Component Value Date   CREATININE 0.62 05/13/2020   BUN 7 05/13/2020   NA 138 05/13/2020   K 4.1 05/13/2020   CL 101 05/13/2020   CO2 26 05/13/2020   Lab Results  Component Value Date   ALT 9 05/13/2020    AST 12 05/13/2020   ALKPHOS 104 05/13/2020   BILITOT 0.6 05/13/2020   Lab Results  Component Value Date   HGBA1C 5.3 05/13/2020   Lab Results  Component Value Date   INSULIN 6.6 05/13/2020   Lab Results  Component Value Date   TSH 1.340 05/13/2020   Lab Results  Component Value Date   CHOL 178 05/13/2020   HDL 55 05/13/2020   LDLCALC 112 (H) 05/13/2020   TRIG 58 05/13/2020   Lab Results  Component Value Date   WBC 5.7 05/13/2020   HGB 12.9 05/13/2020   HCT 38.7 05/13/2020   MCV 93 05/13/2020   PLT 287 05/13/2020   Attestation Statements:   Reviewed by clinician on day of visit: allergies, medications, problem list, medical history, surgical history, family history, social history, and previous encounter notes.  I, Insurance claims handler, CMA, am acting as Energy manager for [employer], DO.  I have reviewed the above documentation for accuracy and completeness, and I agree with the above. -  Thomasene Lot, DO

## 2020-06-16 ENCOUNTER — Telehealth (INDEPENDENT_AMBULATORY_CARE_PROVIDER_SITE_OTHER): Payer: Self-pay | Admitting: Psychology

## 2020-06-23 ENCOUNTER — Other Ambulatory Visit: Payer: Self-pay

## 2020-06-23 ENCOUNTER — Ambulatory Visit (INDEPENDENT_AMBULATORY_CARE_PROVIDER_SITE_OTHER): Payer: Medicaid Other | Admitting: Family Medicine

## 2020-06-23 VITALS — BP 137/78 | HR 90 | Temp 98.7°F | Ht 65.0 in | Wt 262.0 lb

## 2020-06-23 DIAGNOSIS — N946 Dysmenorrhea, unspecified: Secondary | ICD-10-CM | POA: Diagnosis not present

## 2020-06-23 DIAGNOSIS — Z6841 Body Mass Index (BMI) 40.0 and over, adult: Secondary | ICD-10-CM

## 2020-06-23 DIAGNOSIS — E559 Vitamin D deficiency, unspecified: Secondary | ICD-10-CM

## 2020-06-23 MED ORDER — VITAMIN D (ERGOCALCIFEROL) 1.25 MG (50000 UNIT) PO CAPS: ORAL_CAPSULE | ORAL | 0 refills | Status: DC

## 2020-06-24 NOTE — Progress Notes (Signed)
Chief Complaint:   OBESITY Cathy Yang is here to discuss her progress with her obesity treatment plan along with follow-up of her obesity related diagnoses. [name] is on the Category 2 Plan and states she is following her eating plan approximately 85% of the time. [name] states she is doing water aerobics for 60 minutes 2 times per week.  Today's visit was #: 4 Starting weight: 271 lbs Starting date: 05/13/2020 Today's weight: 262 lbs Today's date: 06/23/2020 Total lbs lost to date: 9 lbs Total lbs lost since last in-office visit: 2 lbs  Interim History: [name] says she has been nauseated and feeling ill for 8 days.  She says her menstrual cycle was horrible last time.  She was off cycle on the 28th.  She had lots of clots and abdominal pains with this last cycle.  She had periods up until 4 months with her last pregnancy, but never had clots and abdominal pains.  Subjective:   1. Vitamin D deficiency Cathy Yang's Vitamin D level was 30.7 on 05/13/2020. She is currently taking prescription vitamin D 50,000 IU each week. She denies nausea, vomiting or muscle weakness.  2. Dysmenorrhea Cathy Yang has abdominal pain and irregular menses.  Denies fever/chills.  Some nausea but no vomiting.   Assessment/Plan:   1. Vitamin D deficiency Low Vitamin D level contributes to fatigue and are associated with obesity, breast, and colon cancer. She agrees to continue to take prescription Vitamin D @50 ,000 IU every week and will follow-up for routine testing of Vitamin D, at least 2-3 times per year to avoid over-replacement.  -Refill Vitamin D, Ergocalciferol, (DRISDOL) 1.25 MG (50000 UNIT) CAPS capsule; Take one tablet every Sunday and take one tablet every Wednesday.  Dispense: 8 capsule; Refill: 0  2. Dysmenorrhea Call Dr. Sunday, OB/GYN at [facility] for an appointment  Risks and possible diagnoses discussed with her.  She knows she needs to have a GYN.  3. Class 3 severe obesity  with serious comorbidity and body mass index (BMI) of 40.0 to 44.9 in adult, unspecified obesity type (HCC) Cathy Yang is currently in the action stage of change. As such, her goal is to continue with weight loss efforts. She has agreed to the Category 2 Plan.   Exercise goals: As is.  Behavioral modification strategies: increasing lean protein intake, decreasing simple carbohydrates, meal planning and cooking strategies, keeping healthy foods in the home and planning for success.  Cathy Yang has agreed to follow-up with our clinic in 2 weeks. She was informed of the importance of frequent follow-up visits to maximize her success with intensive lifestyle modifications for her multiple health conditions.   Objective:   Blood pressure 137/78, pulse 90, temperature 98.7 F (37.1 C), height 5\' 5"  (1.651 m), weight 262 lb (118.8 kg), SpO2 100 %. Body mass index is 43.6 kg/m.  General: Cooperative, alert, well developed, in no acute distress. HEENT: Conjunctivae and lids unremarkable. Cardiovascular: Regular rhythm.  Lungs: Normal work of breathing. Neurologic: No focal deficits.   Lab Results  Component Value Date   CREATININE 0.62 05/13/2020   BUN 7 05/13/2020   NA 138 05/13/2020   K 4.1 05/13/2020   CL 101 05/13/2020   CO2 26 05/13/2020   Lab Results  Component Value Date   ALT 9 05/13/2020   AST 12 05/13/2020   ALKPHOS 104 05/13/2020   BILITOT 0.6 05/13/2020   Lab Results  Component Value Date   HGBA1C 5.3 05/13/2020   Lab Results  Component Value  Date   INSULIN 6.6 05/13/2020   Lab Results  Component Value Date   TSH 1.340 05/13/2020   Lab Results  Component Value Date   CHOL 178 05/13/2020   HDL 55 05/13/2020   LDLCALC 112 (H) 05/13/2020   TRIG 58 05/13/2020   Lab Results  Component Value Date   WBC 5.7 05/13/2020   HGB 12.9 05/13/2020   HCT 38.7 05/13/2020   MCV 93 05/13/2020   PLT 287 05/13/2020   Attestation Statements:   Reviewed by clinician on day of  visit: allergies, medications, problem list, medical history, surgical history, family history, social history, and previous encounter notes.  I, Insurance claims handler, CMA, am acting as Energy manager for [employer], DO.  I have reviewed the above documentation for accuracy and completeness, and I agree with the above. Thomasene Lot, DO

## 2020-06-30 ENCOUNTER — Inpatient Hospital Stay (HOSPITAL_COMMUNITY)
Admission: AD | Admit: 2020-06-30 | Discharge: 2020-06-30 | Disposition: A | Discharge status: Home / Self Care | Payer: Medicaid Other | Attending: Obstetrics & Gynecology | Admitting: Obstetrics & Gynecology

## 2020-06-30 ENCOUNTER — Encounter (HOSPITAL_COMMUNITY): Payer: Self-pay | Admitting: Obstetrics & Gynecology

## 2020-06-30 DIAGNOSIS — E7800 Pure hypercholesterolemia, unspecified: Secondary | ICD-10-CM | POA: Insufficient documentation

## 2020-06-30 DIAGNOSIS — Z9884 Bariatric surgery status: Secondary | ICD-10-CM | POA: Insufficient documentation

## 2020-06-30 DIAGNOSIS — Z86711 Personal history of pulmonary embolism: Secondary | ICD-10-CM | POA: Insufficient documentation

## 2020-06-30 DIAGNOSIS — Z3202 Encounter for pregnancy test, result negative: Secondary | ICD-10-CM | POA: Diagnosis not present

## 2020-06-30 DIAGNOSIS — R109 Unspecified abdominal pain: Secondary | ICD-10-CM | POA: Diagnosis not present

## 2020-06-30 DIAGNOSIS — M419 Scoliosis, unspecified: Secondary | ICD-10-CM | POA: Insufficient documentation

## 2020-06-30 DIAGNOSIS — Z8249 Family history of ischemic heart disease and other diseases of the circulatory system: Secondary | ICD-10-CM | POA: Insufficient documentation

## 2020-06-30 DIAGNOSIS — Z79899 Other long term (current) drug therapy: Secondary | ICD-10-CM | POA: Diagnosis not present

## 2020-06-30 DIAGNOSIS — Z886 Allergy status to analgesic agent status: Secondary | ICD-10-CM | POA: Diagnosis not present

## 2020-06-30 DIAGNOSIS — I10 Essential (primary) hypertension: Secondary | ICD-10-CM | POA: Insufficient documentation

## 2020-06-30 DIAGNOSIS — Z9049 Acquired absence of other specified parts of digestive tract: Secondary | ICD-10-CM | POA: Diagnosis not present

## 2020-06-30 LAB — URINALYSIS, ROUTINE W REFLEX MICROSCOPIC
Bilirubin Urine: NEGATIVE
Glucose, UA: NEGATIVE mg/dL
Hgb urine dipstick: NEGATIVE
Ketones, ur: NEGATIVE mg/dL
Nitrite: NEGATIVE
Protein, ur: NEGATIVE mg/dL
Specific Gravity, Urine: 1.013 (ref 1.005–1.030)
pH: 7 (ref 5.0–8.0)

## 2020-06-30 LAB — CBC WITH DIFFERENTIAL/PLATELET
Abs Immature Granulocytes: 0.01 10*3/uL (ref 0.00–0.07)
Basophils Absolute: 0 10*3/uL (ref 0.0–0.1)
Basophils Relative: 1 %
Eosinophils Absolute: 0.1 10*3/uL (ref 0.0–0.5)
Eosinophils Relative: 1 %
HCT: 42.4 % (ref 36.0–46.0)
Hemoglobin: 13.8 g/dL (ref 12.0–15.0)
Immature Granulocytes: 0 %
Lymphocytes Relative: 39 %
Lymphs Abs: 2.3 10*3/uL (ref 0.7–4.0)
MCH: 30.5 pg (ref 26.0–34.0)
MCHC: 32.5 g/dL (ref 30.0–36.0)
MCV: 93.6 fL (ref 80.0–100.0)
Monocytes Absolute: 0.5 10*3/uL (ref 0.1–1.0)
Monocytes Relative: 9 %
Neutro Abs: 3 10*3/uL (ref 1.7–7.7)
Neutrophils Relative %: 50 %
Platelets: 307 10*3/uL (ref 150–400)
RBC: 4.53 MIL/uL (ref 3.87–5.11)
RDW: 13.7 % (ref 11.5–15.5)
WBC: 5.9 10*3/uL (ref 4.0–10.5)
nRBC: 0 % (ref 0.0–0.2)

## 2020-06-30 LAB — POCT PREGNANCY, URINE: Preg Test, Ur: NEGATIVE

## 2020-06-30 LAB — HCG, SERUM, QUALITATIVE: Preg, Serum: NEGATIVE

## 2020-06-30 NOTE — MAU Provider Note (Signed)
History     CSN: [id number]  Arrival date and time: 06/30/20 1045   First Provider Initiated Contact with Patient 06/30/20 1256      Chief Complaint  Patient presents with  . Abdominal Pain   HPI  Ms.Cathy Yang is a 34 y.o. female G1P1 non pregnant female here reporting she feels something moving in her abdomen. She reports having an IUD removed 4 months ago due to a PE. Reports having a negative DVT study and an echo that was normal. She does not recall having a chest CT. She was told her IUD needed to come out. She reports not trying to conceive currently.  Last period was the end of AUG, has not missed her period yet. Period Comes every 28 weeks.    Hx of chronic hypertension; however reports it's only elevated randomly. She was in a lot of pain yesterday and that is why her BP is 148/100.   OB History    Gravida  1   Para  1   Term      Preterm      AB      Living        SAB      TAB      Ectopic      Multiple      Live Births              Past Medical History:  Diagnosis Date  . Anemia   . Anxiety   . Back pain   . Chronic cholecystitis   . Depression   . High cholesterol   . Hypertension   . Lactose intolerance   . Nausea & vomiting    PAIN ALSO WITH GALLBALDDER PROBLEM LAST 3 MONTHS  . Scoliosis   . Vitamin D deficiency     Past Surgical History:  Procedure Laterality Date  . CESAREAN SECTION  01/25/2010   X1  . CHOLECYSTECTOMY N/A 07/10/2018   Procedure: LAPAROSCOPIC CHOLECYSTECTOMY WITH INTRAOPERATIVE CHOLANGIOGRAM;  Surgeon: Luretha Murphy, MD;  Location: [facility];  Service: General;  Laterality: N/A;  . FIXATION KYPHOPLASTY  AGE 17  . 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 Hypertension Mother   .  Diabetes Mother   . Stroke Mother   . Kidney disease Mother   . Cancer Other     Social History   Tobacco Use  . Smoking status: Never Smoker  . Smokeless tobacco: Never Used  Vaping Use  . Vaping Use: Never used  Substance Use Topics  . Alcohol use: Not Currently  . Drug use: No    Allergies:  Allergies  Allergen Reactions  . Nsaids Other (See Comments)    Unable to take due to gastric sleeve    Medications Prior to Admission  Medication Sig Dispense Refill Last Dose  . acetaminophen (TYLENOL) 500 MG tablet Take 500 mg by mouth every 6 (six) hours as needed.   06/30/2020 at Unknown time  . Vitamin D, Ergocalciferol, (DRISDOL) 1.25 MG (50000 UNIT) CAPS capsule Take one tablet every Sunday and take one tablet every Wednesday. 8 capsule 0 Past Week at Unknown time   Results for orders placed or performed during the hospital encounter of 06/30/20 (from the past 72 hour(s))  Urinalysis, Routine w reflex microscopic  Status: Abnormal   Collection Time: 06/30/20 11:23 AM  Result Value Ref Range   Color, Urine YELLOW YELLOW   APPearance HAZY (A) CLEAR   Specific Gravity, Urine 1.013 1.005 - 1.030   pH 7.0 5.0 - 8.0   Glucose, UA NEGATIVE NEGATIVE mg/dL   Hgb urine dipstick NEGATIVE NEGATIVE   Bilirubin Urine NEGATIVE NEGATIVE   Ketones, ur NEGATIVE NEGATIVE mg/dL   Protein, ur NEGATIVE NEGATIVE mg/dL   Nitrite NEGATIVE NEGATIVE   Leukocytes,Ua LARGE (A) NEGATIVE   RBC / HPF 0-5 0 - 5 RBC/hpf   WBC, UA 0-5 0 - 5 WBC/hpf   Bacteria, UA RARE (A) NONE SEEN   Squamous Epithelial / LPF 21-50 0 - 5   Mucus PRESENT     Comment: Performed at [facility], [address], [place], [place] [address]  Pregnancy, urine POC     Status: None   Collection Time: 06/30/20 11:34 AM  Result Value Ref Range   Preg Test, Ur NEGATIVE NEGATIVE    Comment:        THE SENSITIVITY OF THIS METHODOLOGY IS >24 mIU/mL   hCG, serum, qualitative     Status: None   Collection Time: 06/30/20   1:35 PM  Result Value Ref Range   Preg, Serum NEGATIVE NEGATIVE    Comment:        THE SENSITIVITY OF THIS METHODOLOGY IS >10 mIU/mL. Performed at [facility], [address] [place], [place] [address]   CBC with Differential/Platelet     Status: None   Collection Time: 06/30/20  1:35 PM  Result Value Ref Range   WBC 5.9 4.0 - 10.5 K/uL   RBC 4.53 3.87 - 5.11 MIL/uL   Hemoglobin 13.8 12.0 - 15.0 g/dL   HCT 86.7 36 - 46 %   MCV 93.6 80.0 - 100.0 fL   MCH 30.5 26.0 - 34.0 pg   MCHC 32.5 30.0 - 36.0 g/dL   RDW 67.2 09.4 - 70.9 %   Platelets 307 150 - 400 K/uL   nRBC 0.0 0.0 - 0.2 %   Neutrophils Relative % 50 %   Neutro Abs 3.0 1.7 - 7.7 K/uL   Lymphocytes Relative 39 %   Lymphs Abs 2.3 0.7 - 4.0 K/uL   Monocytes Relative 9 %   Monocytes Absolute 0.5 0 - 1 K/uL   Eosinophils Relative 1 %   Eosinophils Absolute 0.1 0 - 0 K/uL   Basophils Relative 1 %   Basophils Absolute 0.0 0 - 0 K/uL   Immature Granulocytes 0 %   Abs Immature Granulocytes 0.01 0.00 - 0.07 K/uL    Comment: Performed at [facility], [address], [place], [place] [address]     Review of Systems  Constitutional: Negative for fever.  Gastrointestinal: Positive for abdominal pain.  Genitourinary: Negative for vaginal bleeding and vaginal discharge.   Physical Exam   Blood pressure 132/86, pulse 92, temperature 98.9 F (37.2 C), temperature source Oral, resp. rate 20, height 5\' 5"  (1.651 m), weight 119.1 kg, last menstrual period 06/12/2020, SpO2 100 %.  Physical Exam Vitals reviewed.  Constitutional:      General: She is not in acute distress.    Appearance: She is well-developed. She is obese. She is not ill-appearing, toxic-appearing or diaphoretic.  HENT:     Head: Normocephalic.  Abdominal:     Tenderness: There is generalized abdominal tenderness. There is no guarding or rebound.  Genitourinary:  Comments: Cervix closed, posterior, thick.  Uterus non tender, normal  size Adnexa non tender, no masses bilaterally GC/Chlam, wet prep done- patient declined  Chaperone present for exam.  Skin:    General: Skin is warm.  Neurological:     Mental Status: She is alert.    MAU Course  Procedures  None  MDM  Hcg level negative CBC normal  Assessment and Plan   A:  1. Encounter for pregnancy test, result negative   2. Abdominal cramping     P:  Discharge home in stable condition Return to MAU for OB emergencies  Follow up with GYN to establish care   Arlo Butt, Harolyn Rutherford, NP 06/30/2020 3:05 PM

## 2020-06-30 NOTE — MAU Note (Signed)
Been cramping for over 2 wks now.  Last period was really heavy, passing clots.  4 neg home tests, "they don't work for me".  "When preg with son, continued to have periods for 4 months.."  Has been feeling sluggish, just feels something is wrong. "something is moving around in her stomach".  Having back pain, pain in her stomach, has been nauseated.

## 2020-07-07 ENCOUNTER — Other Ambulatory Visit: Payer: Self-pay

## 2020-07-07 ENCOUNTER — Encounter (INDEPENDENT_AMBULATORY_CARE_PROVIDER_SITE_OTHER): Payer: Self-pay | Admitting: Family Medicine

## 2020-07-07 ENCOUNTER — Ambulatory Visit (INDEPENDENT_AMBULATORY_CARE_PROVIDER_SITE_OTHER): Payer: Medicaid Other | Admitting: Family Medicine

## 2020-07-07 VITALS — BP 118/82 | HR 86 | Temp 98.6°F | Ht 65.0 in | Wt 256.0 lb

## 2020-07-07 DIAGNOSIS — E66813 Obesity, class 3: Secondary | ICD-10-CM

## 2020-07-07 DIAGNOSIS — Z9884 Bariatric surgery status: Secondary | ICD-10-CM

## 2020-07-07 DIAGNOSIS — Z6841 Body Mass Index (BMI) 40.0 and over, adult: Secondary | ICD-10-CM | POA: Diagnosis not present

## 2020-07-07 DIAGNOSIS — E559 Vitamin D deficiency, unspecified: Secondary | ICD-10-CM

## 2020-07-08 ENCOUNTER — Other Ambulatory Visit: Payer: Self-pay | Admitting: Family Medicine

## 2020-07-08 DIAGNOSIS — R102 Pelvic and perineal pain: Secondary | ICD-10-CM

## 2020-07-08 DIAGNOSIS — N926 Irregular menstruation, unspecified: Secondary | ICD-10-CM

## 2020-07-12 NOTE — Progress Notes (Signed)
Chief Complaint:   OBESITY Cathy Yang is here to discuss her progress with her obesity treatment plan along with follow-up of her obesity related diagnoses. Cathy Yang is on the Category 2 Plan and states she is following her eating plan approximately 80% of the time. Cathy Yang states she is exercising for 0 minutes 0 times per week.  Today's visit was #: 5 Starting weight: 271 lbs Starting date: 05/13/2020 Today's weight: 256 lbs Today's date: 07/07/2020 Total lbs lost to date: 15 lbs Total lbs lost since last in-office visit: 6 lbs  Interim History: Cathy Yang moved 3 days ago and had a lot of stress with that.  She says her GI issues are essentially resolved.  She saw her bariatric surgeon, Dr. [name], and Carafate was prescribed to her.  She was told those symptoms are common postsurgically and she must decrease acidic foods and watch what she eats.  Subjective:   1. S/P laparoscopic sleeve gastrectomy Surgery was in 2018 with Dr. [name].  Recently followed up with him regarding GI symptoms.  They are resolving.  2. Vitamin D deficiency Cathy Yang's Vitamin D level was 30.7 on 05/13/2020. She is currently taking prescription vitamin D 50,000 IU twice weekly. She denies nausea, vomiting or muscle weakness.  Denies concerns or issues.  Assessment/Plan:   1. S/P laparoscopic sleeve gastrectomy She will need to eat multiple small meals per day and really spread out the meal plan, get protein in.  Cathy Yang is at risk for malnutrition due to her previous bariatric surgery.   Counseling  You may need to eat 3 meals and 2 snacks, or 5 small meals each day in order to reach your protein and calorie goals.   Allow at least 15 minutes for each meal so that you can eat mindfully. Listen to your body so that you do not overeat. For most people, your sleeve or pouch will comfortably hold 4-6 ounces.  Eat foods from all food groups. This includes fruits and vegetables, grains, dairy, and meat and other  proteins.  Include a protein-rich food at every meal and snack, and eat the protein food first.   You should be taking a Bariatric Multivitamin as well as calcium.   2. Vitamin D deficiency Low Vitamin D level contributes to fatigue and are associated with obesity, breast, and colon cancer. She agrees to continue to take prescription Vitamin D @50 ,000 IU twice weekly and will follow-up for routine testing of Vitamin D, at least 2-3 times per year to avoid over-replacement.  3. Class 3 severe obesity with serious comorbidity and body mass index (BMI) of 40.0 to 44.9 in adult, unspecified obesity type (HCC) Cathy Yang is currently in the action stage of change. As such, her goal is to continue with weight loss efforts. She has agreed to the Category 2 Plan.   Exercise goals: Start walking for 30 minutes 3 days per week.  Behavioral modification strategies: increasing lean protein intake, increasing water intake, meal planning and cooking strategies and planning for success.  Cathy Yang has agreed to follow-up with our clinic in 2 weeks. She was informed of the importance of frequent follow-up visits to maximize her success with intensive lifestyle modifications for her multiple health conditions.   Objective:   Blood pressure 118/82, pulse 86, temperature 98.6 F (37 C), height 5\' 5"  (1.651 m), weight 256 lb (116.1 kg), last menstrual period 06/12/2020, SpO2 100 %. Body mass index is 42.6 kg/m.  General: Cooperative, alert, well developed, in no acute distress. HEENT:  Conjunctivae and lids unremarkable. Cardiovascular: Regular rhythm.  Lungs: Normal work of breathing. Neurologic: No focal deficits.   Lab Results  Component Value Date   CREATININE 0.62 05/13/2020   BUN 7 05/13/2020   NA 138 05/13/2020   K 4.1 05/13/2020   CL 101 05/13/2020   CO2 26 05/13/2020   Lab Results  Component Value Date   ALT 9 05/13/2020   AST 12 05/13/2020   ALKPHOS 104 05/13/2020   BILITOT 0.6 05/13/2020     Lab Results  Component Value Date   HGBA1C 5.3 05/13/2020   Lab Results  Component Value Date   INSULIN 6.6 05/13/2020   Lab Results  Component Value Date   TSH 1.340 05/13/2020   Lab Results  Component Value Date   CHOL 178 05/13/2020   HDL 55 05/13/2020   LDLCALC 112 (H) 05/13/2020   TRIG 58 05/13/2020   Lab Results  Component Value Date   WBC 5.9 06/30/2020   HGB 13.8 06/30/2020   HCT 42.4 06/30/2020   MCV 93.6 06/30/2020   PLT 307 06/30/2020   Attestation Statements:   Reviewed by clinician on day of visit: allergies, medications, problem list, medical history, surgical history, family history, social history, and previous encounter notes.  Time spent on visit including pre-visit chart review and post-visit care and charting was 22 minutes.   I, Insurance claims handler, CMA, am acting as Energy manager for [employer], DO.  I have reviewed the above documentation for accuracy and completeness, and I agree with the above. -  Thomasene Lot, DO

## 2020-07-16 ENCOUNTER — Ambulatory Visit
Admission: RE | Admit: 2020-07-16 | Discharge: 2020-07-16 | Disposition: A | Discharge status: Home / Self Care | Payer: Medicaid Other | Source: Ambulatory Visit | Attending: Family Medicine | Admitting: Family Medicine

## 2020-07-16 DIAGNOSIS — N926 Irregular menstruation, unspecified: Secondary | ICD-10-CM

## 2020-07-16 DIAGNOSIS — R102 Pelvic and perineal pain: Secondary | ICD-10-CM

## 2020-07-26 ENCOUNTER — Other Ambulatory Visit: Payer: Self-pay

## 2020-07-26 ENCOUNTER — Encounter (INDEPENDENT_AMBULATORY_CARE_PROVIDER_SITE_OTHER): Payer: Self-pay | Admitting: Family Medicine

## 2020-07-26 ENCOUNTER — Ambulatory Visit (INDEPENDENT_AMBULATORY_CARE_PROVIDER_SITE_OTHER): Payer: Medicaid Other | Admitting: Family Medicine

## 2020-07-26 VITALS — BP 131/76 | HR 78 | Temp 98.7°F | Ht 65.0 in | Wt 258.0 lb

## 2020-07-26 DIAGNOSIS — E559 Vitamin D deficiency, unspecified: Secondary | ICD-10-CM | POA: Diagnosis not present

## 2020-07-26 DIAGNOSIS — Z6841 Body Mass Index (BMI) 40.0 and over, adult: Secondary | ICD-10-CM

## 2020-07-26 MED ORDER — VITAMIN D (ERGOCALCIFEROL) 1.25 MG (50000 UNIT) PO CAPS: ORAL_CAPSULE | ORAL | 0 refills | Status: DC

## 2020-07-27 NOTE — Progress Notes (Signed)
Chief Complaint:   OBESITY Cathy Yang is here to discuss her progress with her obesity treatment plan along with follow-up of her obesity related diagnoses. Cathy Yang is on the Category 2 Plan and states she is following her eating plan approximately 90% of the time. Cathy Yang states she is walking for 30 minutes 2 times per week.  Today's visit was #: 6 Starting weight: 271 lbs Starting date: 05/13/2020 Today's weight: 258 lbs Today's date: 07/26/2020 Total lbs lost to date: 13 lbs Total lbs lost since last in-office visit: +2 lbs  Interim History: Cathy Yang says that her GYN put her on "a low estrogen" birth control recently because she has PCOS.  She says she skipped a few meals.  Also went to a wedding and birthday parties and ate more bread and sweets than usual.  She skipped breakfast and lunch that day and only had 2 bottles of water that day.  Assessment/Plan:   1. Vitamin D deficiency Mendi's Vitamin D level was 30.7 on 05/13/2020. She is currently taking prescription vitamin D 50,000 IU each week. She denies nausea, vomiting or muscle weakness.  Low Vitamin D level contributes to fatigue and are associated with obesity, breast, and colon cancer. She agrees to continue to take prescription Vitamin D @50 ,000 IU every week and will follow-up for routine testing of Vitamin D, at least 2-3 times per year to avoid over-replacement.  -Refill Vitamin D, Ergocalciferol, (DRISDOL) 1.25 MG (50000 UNIT) CAPS capsule; Take one tablet every Sunday and take one tablet every Wednesday.  Dispense: 8 capsule; Refill: 0  2. Class 3 severe obesity with serious comorbidity and body mass index (BMI) of 40.0 to 44.9 in adult, unspecified obesity type (HCC)  Cathy Yang is currently in the action stage of change. As such, her goal is to continue with weight loss efforts. She has agreed to the Category 2 Plan.   Exercise goals: Goal at last visit was 30 minutes 3 days pwer week.  Try to increase to that over the  next couple of weeks.  Behavioral modification strategies: increasing lean protein intake, decreasing simple carbohydrates, increasing water intake, decreasing eating out, no skipping meals, meal planning and cooking strategies, keeping healthy foods in the home, better snacking choices, celebration eating strategies and planning for success.  Cathy Yang has agreed to follow-up with our clinic in 2 weeks. She was informed of the importance of frequent follow-up visits to maximize her success with intensive lifestyle modifications for her multiple health conditions.   Objective:   Blood pressure 131/76, pulse 78, temperature 98.7 F (37.1 C), height 5\' 5"  (1.651 m), weight 258 lb (117 kg), SpO2 96 %. Body mass index is 42.93 kg/m.  General: Cooperative, alert, well developed, in no acute distress. HEENT: Conjunctivae and lids unremarkable. Cardiovascular: Regular rhythm.  Lungs: Normal work of breathing. Neurologic: No focal deficits.   Lab Results  Component Value Date   CREATININE 0.62 05/13/2020   BUN 7 05/13/2020   NA 138 05/13/2020   K 4.1 05/13/2020   CL 101 05/13/2020   CO2 26 05/13/2020   Lab Results  Component Value Date   ALT 9 05/13/2020   AST 12 05/13/2020   ALKPHOS 104 05/13/2020   BILITOT 0.6 05/13/2020   Lab Results  Component Value Date   HGBA1C 5.3 05/13/2020   Lab Results  Component Value Date   INSULIN 6.6 05/13/2020   Lab Results  Component Value Date   TSH 1.340 05/13/2020   Lab Results  Component  Value Date   CHOL 178 05/13/2020   HDL 55 05/13/2020   LDLCALC 112 (H) 05/13/2020   TRIG 58 05/13/2020   Lab Results  Component Value Date   WBC 5.9 06/30/2020   HGB 13.8 06/30/2020   HCT 42.4 06/30/2020   MCV 93.6 06/30/2020   PLT 307 06/30/2020   Attestation Statements:   Reviewed by clinician on day of visit: allergies, medications, problem list, medical history, surgical history, family history, social history, and previous encounter  notes.  I, Insurance claims handler, CMA, am acting as Energy manager for [employer], DO.  I have reviewed the above documentation for accuracy and completeness, and I agree with the above. Thomasene Lot, DO

## 2020-08-09 ENCOUNTER — Other Ambulatory Visit: Payer: Self-pay

## 2020-08-09 ENCOUNTER — Encounter (INDEPENDENT_AMBULATORY_CARE_PROVIDER_SITE_OTHER): Payer: Self-pay | Admitting: Family Medicine

## 2020-08-09 ENCOUNTER — Ambulatory Visit (INDEPENDENT_AMBULATORY_CARE_PROVIDER_SITE_OTHER): Payer: Medicaid Other | Admitting: Family Medicine

## 2020-08-09 VITALS — BP 112/76 | HR 73 | Temp 98.4°F | Ht 65.0 in | Wt 258.0 lb

## 2020-08-09 DIAGNOSIS — Z6841 Body Mass Index (BMI) 40.0 and over, adult: Secondary | ICD-10-CM | POA: Diagnosis not present

## 2020-08-09 DIAGNOSIS — E559 Vitamin D deficiency, unspecified: Secondary | ICD-10-CM | POA: Diagnosis not present

## 2020-08-09 DIAGNOSIS — E66813 Obesity, class 3: Secondary | ICD-10-CM

## 2020-08-09 MED ORDER — VITAMIN D (ERGOCALCIFEROL) 1.25 MG (50000 UNIT) PO CAPS: ORAL_CAPSULE | ORAL | 0 refills | Status: DC

## 2020-08-11 NOTE — Progress Notes (Signed)
Chief Complaint:   OBESITY Cathy Yang is here to discuss her progress with her obesity treatment plan along with follow-up of her obesity related diagnoses. Cathy Yang is on the Category 2 Plan and states she is following her eating plan approximately 90% of the time. Cathy Yang states she is walking on the treadmill for 30 minutes 3 times per week.  Today's visit was #: 7 Starting weight: 271 lbs Starting date: 05/13/2020 Today's weight: 258 lbs Today's date: 08/09/2020 Total lbs lost to date: 13 lbs Total lbs lost since last in-office visit: 0  Interim History: Cathy Yang is on her menstrual cycle currently.  She feels full a lot, and is not eating much due to dysmenorrhea.  She does not eat on plan and snacks those days on unhealthy foods.  She is not eating on plan other days.  She eats crackers and cheese and drinks coffee from the time she wakes up until dinnertime.  She is not hungry.  Assessment/Plan:   1. Vitamin D deficiency  Cathy Yang has a history of Vitamin D deficiency with resultant generalized fatigue as her primary symptom.  she is taking vitamin D 50,000 IU weekly for this deficiency and tolerating it well without side-effect.   Most recent Vitamin D lab reviewed-  level: 30.7.  Plan:   - Discussed importance of vitamin D (as well as calcium) to their health and well-being.   - We reviewed possible symptoms of low Vitamin D including low energy, depressed mood, muscle aches, joint aches, osteoporosis etc.  - We discussed that low Vitamin D levels may be linked to an increased risk of cardiovascular events and even increased risk of cancers- such as colon and breast.   - Educated pt that weight loss will likely improve availability of vitamin D, thus encouraged Cathy Yang to continue with meal plan and their weight loss efforts to further improve this condition  - I recommend pt take a weekly prescription vit D- see script below- which pt agrees to after discussion of risks  and benefits of this medication.      - Informed patient this may be a lifelong thing, and she was encouraged to continue to take the medicine until pt told otherwise.   We will need to monitor levels regularly ( q 3-4 mo on average )  to keep levels within normal limits.   - All pt's questions and concerns regarding this condition addressed  -Refill Vitamin D, Ergocalciferol, (DRISDOL) 1.25 MG (50000 UNIT) CAPS capsule; Take one tablet every Sunday and take one tablet every Wednesday.  Dispense: 8 capsule; Refill: 0  2. Class 3 severe obesity with serious comorbidity and body mass index (BMI) of 40.0 to 44.9 in adult, unspecified obesity type (HCC)  Cathy Yang is currently in the action stage of change. As such, her goal is to continue with weight loss efforts. She has agreed to the Category 2 Plan.   Exercise goals: For substantial health benefits, adults should do at least 150 minutes (2 hours and 30 minutes) a week of moderate-intensity, or 75 minutes (1 hour and 15 minutes) a week of vigorous-intensity aerobic physical activity, or an equivalent combination of moderate- and vigorous-intensity aerobic activity. Aerobic activity should be performed in episodes of at least 10 minutes, and preferably, it should be spread throughout the week. Increase frequency and intensity of exercise.  Behavioral modification strategies: no skipping meals and planning for success.  Cathy Yang has agreed to follow-up with our clinic in 2 weeks. She was  informed of the importance of frequent follow-up visits to maximize her success with intensive lifestyle modifications for her multiple health conditions.   Objective:   Blood pressure 112/76, pulse 73, temperature 98.4 F (36.9 C), height 5\' 5"  (1.651 m), weight 258 lb (117 kg), SpO2 100 %. Body mass index is 42.93 kg/m.  General: Cooperative, alert, well developed, in no acute distress. HEENT: Conjunctivae and lids unremarkable. Cardiovascular: Regular rhythm.    Lungs: Normal work of breathing. Neurologic: No focal deficits.   Lab Results  Component Value Date   CREATININE 0.62 05/13/2020   BUN 7 05/13/2020   NA 138 05/13/2020   K 4.1 05/13/2020   CL 101 05/13/2020   CO2 26 05/13/2020   Lab Results  Component Value Date   ALT 9 05/13/2020   AST 12 05/13/2020   ALKPHOS 104 05/13/2020   BILITOT 0.6 05/13/2020   Lab Results  Component Value Date   HGBA1C 5.3 05/13/2020   Lab Results  Component Value Date   INSULIN 6.6 05/13/2020   Lab Results  Component Value Date   TSH 1.340 05/13/2020   Lab Results  Component Value Date   CHOL 178 05/13/2020   HDL 55 05/13/2020   LDLCALC 112 (H) 05/13/2020   TRIG 58 05/13/2020   Lab Results  Component Value Date   WBC 5.9 06/30/2020   HGB 13.8 06/30/2020   HCT 42.4 06/30/2020   MCV 93.6 06/30/2020   PLT 307 06/30/2020   Attestation Statements:   Reviewed by clinician on day of visit: allergies, medications, problem list, medical history, surgical history, family history, social history, and previous encounter notes.  I, 08/30/2020, CMA, am acting as Insurance claims handler for Energy manager, DO.  I have reviewed the above documentation for accuracy and completeness, and I agree with the above. Cathy Yang, D.O.  The 21st Century Cures Act was signed into law in 2016 which includes the topic of electronic health records.  This provides immediate access to information in MyChart.  This includes consultation notes, operative notes, office notes, lab results and pathology reports.  If you have any questions about what you read please let 2017 know at your next visit so we can discuss your concerns and take corrective action if need be.  We are right here with you.

## 2020-08-23 ENCOUNTER — Ambulatory Visit (INDEPENDENT_AMBULATORY_CARE_PROVIDER_SITE_OTHER): Payer: Medicaid Other | Admitting: Family Medicine

## 2020-08-23 ENCOUNTER — Encounter (INDEPENDENT_AMBULATORY_CARE_PROVIDER_SITE_OTHER): Payer: Self-pay | Admitting: Family Medicine

## 2020-08-23 ENCOUNTER — Other Ambulatory Visit: Payer: Self-pay

## 2020-08-23 VITALS — BP 112/76 | HR 70 | Temp 100.0°F | Ht 65.0 in | Wt 259.0 lb

## 2020-08-23 DIAGNOSIS — F418 Other specified anxiety disorders: Secondary | ICD-10-CM

## 2020-08-24 NOTE — Progress Notes (Signed)
Chief Complaint:   OBESITY Cathy Yang is here to discuss her progress with her obesity treatment plan along with follow-up of her obesity related diagnoses. Cathy Yang is on the Category 2 Plan and states she is following her eating plan approximately 70% of the time. Cathy Yang states she is walking for 30 minutes 2 times per week.  Today's visit was #: 8 Starting weight: 271 lbs Starting date: 05/13/2020 Today's weight: 259 lbs Today's date: 08/23/2020 Total lbs lost to date: 12 lbs Total lbs lost since last in-office visit: +1 lb Total weight loss percentage to date: -4.43%  Interim History: Cathy Yang's grandfather passed away over the last 1.5-2 weeks.  She was out of town.  She saw her biological father at the funeral and has not seen him in many years.  He ignored her and made her spiral into a depressed state.  A lot of old memories surfaced.  She has a 33 year old and a fiance at home along with a very good support system of friends/family.  Assessment/Plan:   1. Depression with anxiety Denies SI.  She says she has too much to live for (16 year old son, fiance, friends and family).  She just feels like she wants to crawl into a hole.  Denies ideations.    In the past, she was on Prozac and Wellbutrin along with Buspar, but Lamictal/Buspar combo worked the best.  She was on it for a short time and felt better, so went off on her own.  Plan:  Follow-up with PCP/GYN, who had her on medications prior.  Message also sent to PCP while I was in clinic with pt regarding Cathy Yang's need to get back on mood medications.   - Referral sent to [facility] for counseling and in the mean time, she will schedule a follow-up with Dr. Dewaine Conger to bridge her until she establishes with a BH counselor, which I recommend she do in addition to getting back on medications for mood. - Ambulatory referral to Behavioral Health    2. Class 3 severe obesity with serious comorbidity and body mass index (BMI) of 40.0 to 44.9 in  adult, unspecified obesity type (HCC)  Cathy Yang is currently in the action stage of change. As such, her goal is to continue with weight loss efforts. She has agreed to the Category 2 Plan.   Exercise goals: Meditation for 10-15 minutes twice daily for depression on YouTube or Calm app plus walking for 20 minutes per day plus consider journaling.  Behavioral modification strategies: emotional eating strategies.  [name] has agreed to follow-up with our clinic in 2 weeks or sooner if concerns. She was informed of the importance of frequent follow-up visits to maximize her success with intensive lifestyle modifications for her multiple health conditions.     Objective:   Blood pressure 112/76, pulse 70, temperature 100 F (37.8 C), height 5\' 5"  (1.651 m), weight 259 lb (117.5 kg), SpO2 100 %. Body mass index is 43.1 kg/m.  General: Cooperative, alert, well developed, tearful and crying during office visit today. HEENT: Normocephalic, atraumatic Skin: Warm and dry, cap RF less 2 sec, good turgor Chest:  Normal excursion, shape, no gross abn Respiratory: speaking in full sentences, no conversational dyspnea NeuroM-Sk: Ambulates w/o assistance, moves * 4 Psych: A and O *3, insight good, mood-full   Lab Results  Component Value Date   CREATININE 0.62 05/13/2020   BUN 7 05/13/2020   NA 138 05/13/2020   K 4.1 05/13/2020   CL 101 05/13/2020  CO2 26 05/13/2020   Lab Results  Component Value Date   ALT 9 05/13/2020   AST 12 05/13/2020   ALKPHOS 104 05/13/2020   BILITOT 0.6 05/13/2020   Lab Results  Component Value Date   HGBA1C 5.3 05/13/2020   Lab Results  Component Value Date   INSULIN 6.6 05/13/2020   Lab Results  Component Value Date   TSH 1.340 05/13/2020   Lab Results  Component Value Date   CHOL 178 05/13/2020   HDL 55 05/13/2020   LDLCALC 112 (H) 05/13/2020   TRIG 58 05/13/2020   Lab Results  Component Value Date   WBC 5.9 06/30/2020   HGB 13.8 06/30/2020     HCT 42.4 06/30/2020   MCV 93.6 06/30/2020   PLT 307 06/30/2020   Attestation Statements:   Reviewed by clinician on day of visit: allergies, medications, problem list, medical history, surgical history, family history, social history, and previous encounter notes.  Time spent on visit including pre-visit chart review and post-visit care and charting was 30 minutes.   I, Insurance claims handler, CMA, am acting as Energy manager for [employer], DO.  I have reviewed the above documentation for accuracy and completeness, and I agree with the above. Carlye Grippe, D.O.  The 21st Century Cures Act was signed into law in 2016 which includes the topic of electronic health records.  This provides immediate access to information in MyChart.  This includes consultation notes, operative notes, office notes, lab results and pathology reports.  If you have any questions about what you read please let us know at your next visit so we can discuss your concerns and take corrective action if need be.  We are right here with you.

## 2020-09-06 ENCOUNTER — Telehealth (INDEPENDENT_AMBULATORY_CARE_PROVIDER_SITE_OTHER): Payer: Medicaid Other | Admitting: Family Medicine

## 2020-09-06 ENCOUNTER — Encounter (INDEPENDENT_AMBULATORY_CARE_PROVIDER_SITE_OTHER): Payer: Self-pay | Admitting: Family Medicine

## 2020-09-06 ENCOUNTER — Other Ambulatory Visit: Payer: Self-pay

## 2020-09-06 ENCOUNTER — Telehealth (INDEPENDENT_AMBULATORY_CARE_PROVIDER_SITE_OTHER): Payer: Self-pay

## 2020-09-06 VITALS — Temp 101.0°F | Wt 258.0 lb

## 2020-09-06 DIAGNOSIS — E559 Vitamin D deficiency, unspecified: Secondary | ICD-10-CM

## 2020-09-06 DIAGNOSIS — Z6841 Body Mass Index (BMI) 40.0 and over, adult: Secondary | ICD-10-CM | POA: Insufficient documentation

## 2020-09-06 DIAGNOSIS — F418 Other specified anxiety disorders: Secondary | ICD-10-CM | POA: Diagnosis not present

## 2020-09-06 MED ORDER — VITAMIN D (ERGOCALCIFEROL) 1.25 MG (50000 UNIT) PO CAPS: ORAL_CAPSULE | ORAL | 0 refills | Status: DC

## 2020-09-06 NOTE — Addendum Note (Signed)
Addended by: Teressa Senter on: 09/06/2020 04:35 PM   Modules accepted: Orders

## 2020-09-06 NOTE — Telephone Encounter (Signed)
See my chart message

## 2020-09-06 NOTE — Telephone Encounter (Signed)
Verbal consent obtained to conduct video visit, via telehealth 

## 2020-09-09 NOTE — Progress Notes (Signed)
TeleHealth Visit:  Due to the COVID-19 pandemic, this visit was completed with telemedicine (audio/video) technology to reduce patient and provider exposure as well as to preserve personal protective equipment.   Cathy Yang has verbally consented to this TeleHealth visit. The patient is located at home, the provider is located at the [facility] office. The participants in this visit include the listed provider and patient. The visit was conducted today via MyChart.    Chief Complaint: OBESITY Cathy Yang is here to discuss her progress with her obesity treatment plan along with follow-up of her obesity related diagnoses. Cathy Yang is on the Category 2 Plan and states she is following her eating plan approximately 70% of the time. Cathy Yang states she is exercising 0 minutes 0 times per week.  Today's visit was #: 9 Starting weight: 271 lbs Starting date: 05/13/2020  Interim History: Cathy Yang has a headache and temperature of 100.0 this morning, hence her visit being converted to a video visit today. Due to more difficult emotional times, Cathy Yang hasn't been able to eat on the plan that much lately.  Plan: I recommend that Cathy Yang be seen by her PCP or Urgent Care if symptoms have not improved by tomorrow for further assessment and evaluation. Questionable until then.     Assessment/Plan:   1. Vitamin D deficiency Cathy Yang's Vitamin D level was 30.7 on 05/13/2020. She is currently taking prescription vitamin D 50,000 IU each week. She denies nausea, vomiting or muscle weakness.  REFILL- Vitamin D, Ergocalciferol, (DRISDOL) 1.25 MG (50000 UNIT) CAPS capsule; Take one tablet every Sunday and take one tablet every Wednesday.  Dispense: 8 capsule; Refill: 0  2. Depression with anxiety Cathy Yang did follow up with her PCP, who put her back on Lamictal and Buspar. She reports that she is tolerating it well and doing a lot better emotionally. Cathy Yang was given a [employer] referral and had  issues with it.  Plan: Cathy Yang will continue medications, per PCP. Resend referral for Cognitive Behavioral Therapy, exercise and prudent nutritional plan.  3. Class 3 severe obesity with serious comorbidity and body mass index (BMI) of 40.0 to 44.9 in adult, unspecified obesity type (HCC) Cathy Yang is currently in the action stage of change. As such, her goal is to continue with weight loss efforts. She has agreed to the Category 2 Plan.   Exercise goals: All adults should avoid inactivity. Some physical activity is better than none, and adults who participate in any amount of physical activity gain some health benefits.  Behavioral modification strategies: emotional eating strategies, holiday eating strategies  and avoiding temptations.  Cathy Yang has agreed to follow-up with our clinic in 2-3 weeks. She was informed of the importance of frequent follow-up visits to maximize her success with intensive lifestyle modifications for her multiple health conditions.  Objective:   VITALS: Per patient if applicable, see vitals. GENERAL: Alert and in no acute distress. CARDIOPULMONARY: No increased WOB. Speaking in clear sentences.  PSYCH: Pleasant and cooperative. Speech normal rate and rhythm. Affect is appropriate. Insight and judgement are appropriate. Attention is focused, linear, and appropriate.  NEURO: Oriented as arrived to appointment on time with no prompting.   Lab Results  Component Value Date   CREATININE 0.62 05/13/2020   BUN 7 05/13/2020   NA 138 05/13/2020   K 4.1 05/13/2020   CL 101 05/13/2020   CO2 26 05/13/2020   Lab Results  Component Value Date   ALT 9 05/13/2020   AST 12 05/13/2020  ALKPHOS 104 05/13/2020   BILITOT 0.6 05/13/2020   Lab Results  Component Value Date   HGBA1C 5.3 05/13/2020   Lab Results  Component Value Date   INSULIN 6.6 05/13/2020   Lab Results  Component Value Date   TSH 1.340 05/13/2020   Lab Results  Component Value Date   CHOL 178  05/13/2020   HDL 55 05/13/2020   LDLCALC 112 (H) 05/13/2020   TRIG 58 05/13/2020   Lab Results  Component Value Date   WBC 5.9 06/30/2020   HGB 13.8 06/30/2020   HCT 42.4 06/30/2020   MCV 93.6 06/30/2020   PLT 307 06/30/2020    Attestation Statements:   Reviewed by clinician on day of visit: allergies, medications, problem list, medical history, surgical history, family history, social history, and previous encounter notes.  Edmund Hilda, am acting as Energy manager for [employer], DO.   I have reviewed the above documentation for accuracy and completeness, and I agree with the above. Cathy Yang, D.O.  The 21st Century Cures Act was signed into law in 2016 which includes the topic of electronic health records.  This provides immediate access to information in MyChart.  This includes consultation notes, operative notes, office notes, lab results and pathology reports.  If you have any questions about what you read please let us know at your next visit so we can discuss your concerns and take corrective action if need be.  We are right here with you.

## 2020-10-14 ENCOUNTER — Telehealth (INDEPENDENT_AMBULATORY_CARE_PROVIDER_SITE_OTHER): Payer: Self-pay

## 2020-10-14 DIAGNOSIS — F418 Other specified anxiety disorders: Secondary | ICD-10-CM

## 2020-10-14 NOTE — Telephone Encounter (Signed)
Referral to [facility].

## 2020-10-20 ENCOUNTER — Other Ambulatory Visit: Payer: Self-pay

## 2020-10-20 ENCOUNTER — Ambulatory Visit
Admission: EM | Admit: 2020-10-20 | Discharge: 2020-10-20 | Disposition: A | Discharge status: Home / Self Care | Payer: Medicaid Other | Attending: Emergency Medicine | Admitting: Emergency Medicine

## 2020-10-20 DIAGNOSIS — Z1152 Encounter for screening for COVID-19: Secondary | ICD-10-CM | POA: Insufficient documentation

## 2020-10-20 DIAGNOSIS — J069 Acute upper respiratory infection, unspecified: Secondary | ICD-10-CM | POA: Diagnosis not present

## 2020-10-20 LAB — POCT RAPID STREP A (OFFICE): Rapid Strep A Screen: NEGATIVE

## 2020-10-20 MED ORDER — FLUTICASONE PROPIONATE 50 MCG/ACT NA SUSP: 1.0000 | Freq: Every day | NASAL | 0 refills | Status: DC

## 2020-10-20 MED ORDER — BENZONATATE 200 MG PO CAPS: 200.0000 mg | ORAL_CAPSULE | Freq: Three times a day (TID) | ORAL | 0 refills | Status: AC | PRN

## 2020-10-20 NOTE — ED Provider Notes (Signed)
[facility]    CSN: [id number] Arrival date & time: 10/20/20  1914      History   Chief Complaint Chief Complaint  Patient presents with  . Cough    X 3 days  . Headache    X 3 days  . Generalized Body Aches    X 3 days    HPI Cathy Yang is a 34 y.o. female presenting today for evaluation of cough congestion headache.  Reports symptoms over the past 3 days.  Son sick, but no known Covid exposures.  Has felt chilled and slightly achy.  Denies any known fevers.  HPI  Past Medical History:  Diagnosis Date  . Anemia   . Anxiety   . Back pain   . Chronic cholecystitis   . Depression   . High cholesterol   . Hypertension   . Lactose intolerance   . Nausea & vomiting    PAIN ALSO WITH GALLBALDDER PROBLEM LAST 3 MONTHS  . Scoliosis   . Vitamin D deficiency     Patient Active Problem List   Diagnosis Date Noted  . Depression with anxiety 09/06/2020  . Class 3 severe obesity with serious comorbidity and body mass index (BMI) of 40.0 to 44.9 in adult (HCC) 09/06/2020  . Vitamin D deficiency 08/09/2020  . S/P laparoscopic cholecystectomy Sept 2019 07/10/2018  . 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 CESAREAN SECTION  01/25/2010   X1  . CHOLECYSTECTOMY N/A 07/10/2018   Procedure: LAPAROSCOPIC CHOLECYSTECTOMY WITH INTRAOPERATIVE CHOLANGIOGRAM;  Surgeon: Luretha Murphy, MD;  Location: [facility];  Service: General;  Laterality: N/A;  . FIXATION KYPHOPLASTY  AGE 32  . LAPAROSCOPIC GASTRIC SLEEVE RESECTION N/A 10/08/2017   Procedure: LAPAROSCOPIC GASTRIC SLEEVE RESECTION WITH UPPER ENDOSCOPY;  Surgeon: Luretha Murphy, MD;  Location: [facility];  Service: General;  Laterality: N/A;    OB History    Gravida  1   Para  1   Term      Preterm      AB      Living        SAB      IAB      Ectopic      Multiple      Live Births               Home Medications    Prior to  Admission medications   Medication Sig Start Date End Date Taking? Authorizing Provider  benzonatate (TESSALON) 200 MG capsule Take 1 capsule (200 mg total) by mouth 3 (three) times daily as needed for up to 7 days for cough. 10/20/20 10/27/20 Yes Raylee Strehl C, PA-C  fluticasone (FLONASE) 50 MCG/ACT nasal spray Place 1-2 sprays into both nostrils daily. 10/20/20  Yes Cayne Yom C, PA-C  acetaminophen (TYLENOL) 500 MG tablet Take 500 mg by mouth every 6 (six) hours as needed.    [provider]  Vitamin D, Ergocalciferol, (DRISDOL) 1.25 MG (50000 UNIT) CAPS capsule Take one tablet every Sunday and take one tablet every Wednesday. 09/06/20   Thomasene Lot, DO    Family History Family History  Problem Relation Age of Onset  . Hypertension Mother   . Diabetes Mother   . Stroke Mother   . Kidney disease Mother   . Cancer Other     Social History Social History   Tobacco Use  . Smoking status: Never Smoker  . Smokeless tobacco: Never Used  Vaping Use  . Vaping Use: Never used  Substance Use Topics  . Alcohol use: Not Currently  . Drug use: No     Allergies   Nsaids   Review of Systems Review of Systems  Constitutional: Positive for chills. Negative for activity change, appetite change, fatigue and fever.  HENT: Positive for congestion, rhinorrhea, sinus pressure and sore throat. Negative for ear pain and trouble swallowing.   Eyes: Negative for discharge and redness.  Respiratory: Positive for cough. Negative for chest tightness and shortness of breath.   Cardiovascular: Negative for chest pain.  Gastrointestinal: Negative for abdominal pain, diarrhea, nausea and vomiting.  Musculoskeletal: Negative for myalgias.  Skin: Negative for rash.  Neurological: Positive for headaches. Negative for dizziness and light-headedness.     Physical Exam Triage Vital Signs ED Triage Vitals  Enc Vitals Group     BP      Pulse      Resp      Temp      Temp src       SpO2      Weight      Height      Head Circumference      Peak Flow      Pain Score      Pain Loc      Pain Edu?      Excl. in GC?    No data found.  Updated Vital Signs BP 139/76 (BP Location: Right Arm)   Pulse 76   Temp 98.4 F (36.9 C) (Oral)   Resp 20   SpO2 100%   Visual Acuity Right Eye Distance:   Left Eye Distance:   Bilateral Distance:    Right Eye Near:   Left Eye Near:    Bilateral Near:     Physical Exam Vitals and nursing note reviewed.  Constitutional:      Appearance: She is well-developed and well-nourished.     Comments: No acute distress  HENT:     Head: Normocephalic and atraumatic.     Ears:     Comments: Bilateral ears without tenderness to palpation of external auricle, tragus and mastoid, EAC's without erythema or swelling, TM's with good bony landmarks and cone of light. Non erythematous.     Nose: Nose normal.     Mouth/Throat:     Comments: Oral mucosa pink and moist, no tonsillar enlargement or exudate. Posterior pharynx patent and nonerythematous, no uvula deviation or swelling. Normal phonation. Eyes:     Conjunctiva/sclera: Conjunctivae normal.  Cardiovascular:     Rate and Rhythm: Normal rate.  Pulmonary:     Effort: Pulmonary effort is normal. No respiratory distress.     Comments: Breathing comfortably at rest, CTABL, no wheezing, rales or other adventitious sounds auscultated Abdominal:     General: There is no distension.  Musculoskeletal:        General: Normal range of motion.     Cervical back: Neck supple.  Skin:    General: Skin is warm and dry.  Neurological:     Mental Status: She is alert and oriented to person, place, and time.  Psychiatric:        Mood and Affect: Mood and affect normal.      UC Treatments / Results  Labs (all labs ordered are listed, but only abnormal results are displayed) Labs Reviewed  COVID-19, FLU A+B NAA  CULTURE, GROUP A STREP [facility])  POCT RAPID STREP A (OFFICE)     EKG  Radiology No results found.  Procedures Procedures (including critical care time)  Medications Ordered in UC Medications - No data to display  Initial Impression / Assessment and Plan / UC Course  I have reviewed the triage vital signs and the nursing notes.  Pertinent labs & imaging results that were available during my care of the patient were reviewed by me and considered in my medical decision making (see chart for details).     Covid test pending, strep test negative, suspect viral etiology and recommend symptomatic and supportive care.  Rest and fluids.  Discussed strict return precautions. Patient verbalized understanding and is agreeable with plan.  Final Clinical Impressions(s) / UC Diagnoses   Final diagnoses:  Encounter for screening for COVID-19  Viral URI with cough     Discharge Instructions     Covid test pending, strep test negative Tylenol [phone] mg every 4-6 hours Rest and fluids Tessalon for cough Flonase for congestion and pressure May use over-the-counter medicine as needed Follow-up if not improving or worsening     ED Prescriptions    Medication Sig Dispense Auth. Provider   benzonatate (TESSALON) 200 MG capsule Take 1 capsule (200 mg total) by mouth 3 (three) times daily as needed for up to 7 days for cough. 28 capsule Jakiya Bookbinder C, PA-C   fluticasone (FLONASE) 50 MCG/ACT nasal spray Place 1-2 sprays into both nostrils daily. 16 g Samauri Kellenberger, [place] C, PA-C     PDMP not reviewed this encounter.   Lew Dawes, [place] 10/20/20 2047

## 2020-10-20 NOTE — Discharge Instructions (Addendum)
Covid test pending, strep test negative Tylenol [phone] mg every 4-6 hours Rest and fluids Tessalon for cough Flonase for congestion and pressure May use over-the-counter medicine as needed Follow-up if not improving or worsening

## 2020-10-20 NOTE — ED Triage Notes (Signed)
Patient complains of cough, congestion, headaches and body aches, and chills for 3 days. Pt is aox4 and ambulatory.

## 2020-10-23 LAB — CULTURE, GROUP A STREP (THRC)

## 2020-10-24 LAB — COVID-19, FLU A+B NAA
Influenza A, NAA: NOT DETECTED
Influenza B, NAA: NOT DETECTED
SARS-CoV-2, NAA: DETECTED — AB

## 2020-10-25 ENCOUNTER — Other Ambulatory Visit: Payer: Medicaid Other

## 2020-11-11 DIAGNOSIS — M5459 Other low back pain: Secondary | ICD-10-CM | POA: Diagnosis not present

## 2020-11-25 ENCOUNTER — Other Ambulatory Visit: Payer: Self-pay

## 2020-11-25 ENCOUNTER — Ambulatory Visit (HOSPITAL_COMMUNITY): Payer: Medicaid Other | Admitting: Professional

## 2020-12-07 ENCOUNTER — Ambulatory Visit (HOSPITAL_COMMUNITY): Payer: Medicaid Other | Admitting: Professional

## 2020-12-20 ENCOUNTER — Ambulatory Visit (HOSPITAL_COMMUNITY): Payer: Medicaid Other | Admitting: Licensed Clinical Social Worker

## 2020-12-20 ENCOUNTER — Other Ambulatory Visit (INDEPENDENT_AMBULATORY_CARE_PROVIDER_SITE_OTHER): Payer: Self-pay | Admitting: Family Medicine

## 2020-12-20 DIAGNOSIS — E559 Vitamin D deficiency, unspecified: Secondary | ICD-10-CM

## 2021-02-01 ENCOUNTER — Other Ambulatory Visit: Payer: Self-pay

## 2021-02-01 ENCOUNTER — Encounter: Payer: Self-pay | Admitting: Plastic Surgery

## 2021-02-01 ENCOUNTER — Ambulatory Visit (INDEPENDENT_AMBULATORY_CARE_PROVIDER_SITE_OTHER): Payer: 59 | Admitting: Plastic Surgery

## 2021-02-01 DIAGNOSIS — M542 Cervicalgia: Secondary | ICD-10-CM

## 2021-02-01 DIAGNOSIS — G8929 Other chronic pain: Secondary | ICD-10-CM | POA: Diagnosis not present

## 2021-02-01 DIAGNOSIS — N62 Hypertrophy of breast: Secondary | ICD-10-CM | POA: Diagnosis not present

## 2021-02-01 DIAGNOSIS — M546 Pain in thoracic spine: Secondary | ICD-10-CM

## 2021-02-01 DIAGNOSIS — M549 Dorsalgia, unspecified: Secondary | ICD-10-CM | POA: Insufficient documentation

## 2021-02-01 NOTE — Progress Notes (Signed)
Patient ID: Cathy Yang, female    DOB: [birth year], 35 y.o.   MRN: [id number]   Chief Complaint  Patient presents with  . Breast Problem    Mammary Hyperplasia: The patient is a 35 y.o. female with a history of mammary hyperplasia for several years.  She has extremely large breasts causing symptoms that include the following: Back pain in the upper and lower back, including neck pain. She pulls or pins her bra straps to provide better lift and relief of the pressure and pain. She notices relief by holding her breast up manually.  Her shoulder straps cause grooves and pain and pressure that requires padding for relief. Pain medication is sometimes required with motrin and tylenol.  Activities that are hindered by enlarged breasts include: exercise and running.  She has tried supportive clothing as well as fitted bras without improvement.  Her breasts are extremely large and fairly symmetric.  She has hyperpigmentation of the inframammary area on both sides.  The sternal to nipple distance on the right is 44 cm and the left is 44 cm.  The IMF distance is 20 cm.  She is 5 feet 5 inches tall and weighs 266 pounds. BMI 44.3 kg/m2.  Preoperative bra size = 42 F/H cup.  The estimated excess breast tissue to be removed at the time of surgery = 950 grams on the left and 950 grams on the right.  Mammogram history: none.  Family history of breast cancer:  Positive on her grandfather's side of the family.  Tobacco use:  None.  The patient back surgery when she was 35 years old and in 2018 and 20 had a gastric sleeve and a cholecystectomy.  Her original weight was 340 pounds prior to his a gastric sleeve.  She has been working with the [facility] to decrease her overall weight and she has been very good about keeping it off.   Review of Systems  Constitutional: Positive for activity change. Negative for appetite change.  HENT: Negative.   Eyes: Negative.   Respiratory: Negative  for chest tightness and shortness of breath.   Cardiovascular: Negative.  Negative for leg swelling.  Gastrointestinal: Negative.   Endocrine: Negative.   Genitourinary: Negative.   Musculoskeletal: Positive for back pain and neck pain.  Skin: Positive for color change.  Neurological: Negative.   Hematological: Negative.   Psychiatric/Behavioral: Negative.     Past Medical History:  Diagnosis Date  . Anemia   . Anxiety   . Back pain   . Chronic cholecystitis   . Depression   . High cholesterol   . Hypertension   . Lactose intolerance   . Nausea & vomiting    PAIN ALSO WITH GALLBALDDER PROBLEM LAST 3 MONTHS  . Scoliosis   . Vitamin D deficiency     Past Surgical History:  Procedure Laterality Date  . CESAREAN SECTION  01/25/2010   X1  . CHOLECYSTECTOMY N/A 07/10/2018   Procedure: LAPAROSCOPIC CHOLECYSTECTOMY WITH INTRAOPERATIVE CHOLANGIOGRAM;  Surgeon: Luretha Murphy, MD;  Location: [facility];  Service: General;  Laterality: N/A;  . FIXATION KYPHOPLASTY  AGE 55  . LAPAROSCOPIC GASTRIC SLEEVE RESECTION N/A 10/08/2017   Procedure: LAPAROSCOPIC GASTRIC SLEEVE RESECTION WITH UPPER ENDOSCOPY;  Surgeon: Luretha Murphy, MD;  Location: [facility];  Service: General;  Laterality: N/A;      Current Outpatient Medications:  .  acetaminophen (TYLENOL) 500 MG tablet, Take 500 mg by mouth every 6 (six) hours as needed., Disp: ,  Rfl:  .  fluticasone (FLONASE) 50 MCG/ACT nasal spray, Place 1-2 sprays into both nostrils daily., Disp: 16 g, Rfl: 0 .  Vitamin D, Ergocalciferol, (DRISDOL) 1.25 MG (50000 UNIT) CAPS capsule, Take one tablet every Sunday and take one tablet every Wednesday., Disp: 8 capsule, Rfl: 0   Objective:   There were no vitals filed for this visit.  Physical Exam Vitals and nursing note reviewed.  Constitutional:      Appearance: Normal appearance.  HENT:     Head: Normocephalic and atraumatic.  Cardiovascular:     Rate and Rhythm: Normal rate.     Pulses: Normal  pulses.  Pulmonary:     Effort: Pulmonary effort is normal. No respiratory distress.  Abdominal:     General: There is no distension.     Tenderness: There is no abdominal tenderness.  Musculoskeletal:        General: No swelling or deformity.  Skin:    General: Skin is warm.     Coloration: Skin is not jaundiced.     Findings: No bruising.  Neurological:     General: No focal deficit present.     Mental Status: She is alert and oriented to person, place, and time.  Psychiatric:        Mood and Affect: Mood normal.        Behavior: Behavior normal.        Thought Content: Thought content normal.     Assessment & Plan:  Symptomatic mammary hypertrophy  Chronic bilateral thoracic back pain  Neck pain  The patient is a very good candidate for bilateral breast reduction with possible liposuction.  We will send her for physical therapy first especially due to her back surgery history.  She will continue to work with the healthy [facility] wellness center to see if she can decrease her BMI a few points.  She does want to go ahead with a mammogram due to her positive family history of breast cancer.  She knows to give [name] a call once she has finished with her physical therapy and decreased her weight.  Pictures were obtained of the patient and placed in the chart with the patient's or guardian's permission.   Alena Bills Deric Bocock, DO

## 2021-02-10 ENCOUNTER — Other Ambulatory Visit: Payer: Self-pay | Admitting: Plastic Surgery

## 2021-02-10 DIAGNOSIS — Z1231 Encounter for screening mammogram for malignant neoplasm of breast: Secondary | ICD-10-CM

## 2021-02-21 ENCOUNTER — Ambulatory Visit: Payer: 59 | Attending: Plastic Surgery

## 2021-02-21 ENCOUNTER — Other Ambulatory Visit: Payer: Self-pay

## 2021-02-21 DIAGNOSIS — M6281 Muscle weakness (generalized): Secondary | ICD-10-CM | POA: Diagnosis not present

## 2021-02-21 DIAGNOSIS — M546 Pain in thoracic spine: Secondary | ICD-10-CM | POA: Insufficient documentation

## 2021-02-21 DIAGNOSIS — M25551 Pain in right hip: Secondary | ICD-10-CM | POA: Diagnosis present

## 2021-02-21 NOTE — Therapy (Signed)
[facility] [address] [place], [place], [address] Phone: [phone]   Fax:  [phone]  Physical Therapy Evaluation  Patient Details  Name: Aloma Boch MRN: [id number] Date of Birth: [birth year] Referring Provider (PT): Ulice Bold Alena Bills, DO   Encounter Date: 02/21/2021   PT End of Session - 02/21/21 0740    Visit Number 1    Number of Visits 7    Date for PT Re-Evaluation 04/22/21    Authorization Type U[employer] MCD - No FOTO    PT Start Time 0743    PT Stop Time 0820    PT Time Calculation (min) 37 min    Activity Tolerance Patient tolerated treatment well;No increased pain    Behavior During Therapy WFL for tasks assessed/performed           Past Medical History:  Diagnosis Date  . Anemia   . Anxiety   . Back pain   . Chronic cholecystitis   . Depression   . High cholesterol   . Hypertension   . Lactose intolerance   . Nausea & vomiting    PAIN ALSO WITH GALLBALDDER PROBLEM LAST 3 MONTHS  . Scoliosis   . Vitamin D deficiency     Past Surgical History:  Procedure Laterality Date  . CESAREAN SECTION  01/25/2010   X1  . CHOLECYSTECTOMY N/A 07/10/2018   Procedure: LAPAROSCOPIC CHOLECYSTECTOMY WITH INTRAOPERATIVE CHOLANGIOGRAM;  Surgeon: Luretha Murphy, MD;  Location: [facility];  Service: General;  Laterality: N/A;  . FIXATION KYPHOPLASTY  AGE 35  . LAPAROSCOPIC GASTRIC SLEEVE RESECTION N/A 10/08/2017   Procedure: LAPAROSCOPIC GASTRIC SLEEVE RESECTION WITH UPPER ENDOSCOPY;  Surgeon: Luretha Murphy, MD;  Location: [facility];  Service: General;  Laterality: N/A;    There were no vitals filed for this visit.    Subjective Assessment - 02/21/21 0745    Subjective Pt is a pleasant 35 y/o F y/o F who presents to PT with reports of chronic mid back pain due to large breast size. She had scoliosis surgery when she was 35 y/o and in the last two years has had sharp increases in mid and low back pain. Pt also notes  R hip pain that especially increases with prolonged standing. Pt has to readjust her bra straps multiple times per day and is frustrated by constant pulling and discomfort. She is interested in future breast reduction surgery and would also like to work on improving posture.    Pertinent History harrington rod surgery approx 20 years ago    How long can you sit comfortably? 90 minutes    How long can you stand comfortably? 90 minutes - 2 hours    How long can you walk comfortably? indefinte amount    Patient Stated Goals pt would just like to decrease mid back pain and discomfort    Currently in Pain? Yes    Pain Score 6    10/10 mid back pain at worst   Pain Location Back    Pain Orientation Mid    Pain Descriptors / Indicators Other (Comment)   grinding   Pain Type Chronic pain    Pain Onset More than a month ago    Pain Frequency Constant    Aggravating Factors  bending forward, standing    Pain Relieving Factors medication    Multiple Pain Sites Yes    Pain Score 7    Pain Location Hip    Pain Orientation Right    Pain Descriptors / Indicators Aching  Pain Type Chronic pain    Pain Onset More than a month ago    Pain Frequency Constant    Aggravating Factors  standing, stairs    Pain Relieving Factors medication              [facility] PT Assessment - 02/21/21 0001      Assessment   Medical Diagnosis N62 (ICD-10-CM) - Symptomatic mammary hypertrophy; M54.6,G89.29 (ICD-10-CM) - Chronic bilateral thoracic back pain; M54.2 (ICD-10-CM) - Neck pain    Referring Provider (PT) Dillingham, Alena Billslaire S, DO    Hand Dominance Right    Next MD Visit not scheduled    Prior Therapy None      Precautions   Precautions None      Restrictions   Weight Bearing Restrictions No      Balance Screen   Has the patient fallen in the past 6 months No    Has the patient had a decrease in activity level because of a fear of falling?  No    Is the patient reluctant to leave their home because of  a fear of falling?  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      Observation/Other Assessments   Observations slight leg length discrepancy; R ASIS to medial malleolus 92cm - L ASIS to L medial malleolus 90.5 cm    Focus on Therapeutic Outcomes (FOTO)  Not taken - MCD      Sensation   Light Touch Appears Intact      Posture/Postural Control   Posture Comments bilateral rounded shoulders, forward head, increased thoracic kyphosis      Strength   Right Shoulder Flexion 4+/5    Right Shoulder ABduction 4+/5    Left Shoulder Flexion 4+/5    Left Shoulder ABduction 4+/5    Right Hip Flexion 4+/5    Right Hip ABduction 5/5    Right Hip ADduction 4+/5    Left Hip Flexion 5/5    Left Hip ABduction 5/5    Left Hip ADduction 4+/5    Right/Left Knee Right;Left    Right Knee Flexion 5/5    Right Knee Extension 5/5    Left Knee Flexion 5/5    Left Knee Extension 5/5      Palpation   Palpation comment TTP to bilateral cervical and thoracic paraspinals      Transfers   Transfers Sit to Stand;Stand to Sit    Sit to Stand 7: Independent    Stand to Sit 7: Independent                      Objective measurements completed on examination: See above findings.       [facility] Adult PT Treatment/Exercise - 02/21/21 0001      Exercises   Exercises Shoulder      Shoulder Exercises: Seated   Row 10 reps;Theraband    Theraband Level (Shoulder Row) Level 4 (Blue)    Other Seated Exercises seatead thoracic extension over towel x 15      Shoulder Exercises: Stretch   Corner Stretch 30 seconds  PT Education -  02/21/21 0822    Education Details eval findings, HEP, POC    Person(s) Educated Patient    Methods Explanation;Demonstration;Handout    Comprehension Verbalized understanding;Returned demonstration            PT Short Term Goals - 02/21/21 0923      PT SHORT TERM GOAL #1   Title Pt will be compliant and knowledgeable with initial HEP in order to improve carryover    Baseline initial HEP given    Time 3    Period Weeks    Status New    Target Date 03/14/21      PT SHORT TERM GOAL #2   Title Pt will self report mid back pain no greater than 7/10 at worst    Baseline 10/10 at worst    Time 3    Period Weeks    Status New    Target Date 03/14/21             PT Long Term Goals - 02/21/21 1314      PT LONG TERM GOAL #1   Title Pt will demonstrate improved posture and verbalize strategies to improve ergonomics in order to decrease pain with work    Time 6    Period Weeks    Status New    Target Date 04/04/21      PT LONG TERM GOAL #2   Title Pt will be compliant with final advanced HEP in order to maintain and continue improvement    Baseline initial HEP given    Time 6    Period Weeks    Status New    Target Date 04/04/21      PT LONG TERM GOAL #3   Title Pt will self report mid back pain no greater than 4/10 at worst in order to improve comfort and functional ability    Baseline 10/10 at worst    Time 6    Period Weeks    Status New    Target Date 04/04/21      PT LONG TERM GOAL #4   Title Pt will improve bilateral UE MMT to no less than 5/5 for all tested motions    Baseline see flowsheet    Time 6    Period Weeks    Status New    Target Date 04/04/21                  Plan - 02/21/21 0917    Clinical Impression Statement Pt is a pleasant 35 y/o F who presents to PT with reports of chronic mid back pain due to large breast size. Physical findings are consistent with MD impression, as pt has pain with overhead movements, tenderness to paplation  of thoracic paraspinals, and postural deficits that are contributing to her mid back pain relate dot larger breast size. Pt would benefit from skilled PT services working on improving periscapular muscle strength and improving postural endurance in preparation of breast reduction surgery. Pt was also given heel lift due to c/o pain in standing and measure leg length discrepancy, with pt noting instant relief of symptoms. Will asses response to initial HEP and progress as able.    Personal Factors and Comorbidities Time since onset of injury/illness/exacerbation    Examination-Activity Limitations Squat;Stairs;Carry;Lift    Examination-Participation Restrictions Yard Work;Shop;Occupation;Cleaning;Community Activity    Stability/Clinical Decision Making Stable/Uncomplicated    Clinical Decision Making Low    Rehab Potential Good    PT Frequency 1x /  week    PT Duration 6 weeks    PT Treatment/Interventions ADLs/Self Care Home Management;Electrical Stimulation;Cryotherapy;Moist Heat;Iontophoresis 4mg /ml Dexamethasone;Ultrasound;Gait training;Stair training;Functional mobility training;Therapeutic activities;Therapeutic exercise;Neuromuscular re-education;Manual techniques;Dry needling    PT Next Visit Plan assess response to initial HEP and heel lift; progress exercises as able    PT Home Exercise Plan Access Code:    Consulted and Agree with Plan of Care Patient           Patient will benefit from skilled therapeutic intervention in order to improve the following deficits and impairments:  Decreased activity tolerance,Decreased endurance,Decreased range of motion,Decreased strength,Impaired UE functional use,Pain,Postural dysfunction  Visit Diagnosis: Muscle weakness (generalized)  Pain in thoracic spine  Pain in right hip     Problem List Patient Active Problem List   Diagnosis Date Noted  . Symptomatic mammary hypertrophy 02/01/2021  . Back pain 02/01/2021  . Neck pain  02/01/2021  . Depression with anxiety 09/06/2020  . Class 3 severe obesity with serious comorbidity and body mass index (BMI) of 40.0 to 44.9 in adult (HCC) 09/06/2020  . Vitamin D deficiency 08/09/2020  . S/P laparoscopic cholecystectomy Sept 2019 07/10/2018  . 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

## 2021-02-28 ENCOUNTER — Other Ambulatory Visit: Payer: Self-pay

## 2021-02-28 ENCOUNTER — Encounter: Payer: Self-pay | Admitting: Physical Therapy

## 2021-02-28 ENCOUNTER — Ambulatory Visit: Payer: 59 | Admitting: Physical Therapy

## 2021-02-28 DIAGNOSIS — M546 Pain in thoracic spine: Secondary | ICD-10-CM

## 2021-02-28 DIAGNOSIS — M6281 Muscle weakness (generalized): Secondary | ICD-10-CM

## 2021-02-28 DIAGNOSIS — M25551 Pain in right hip: Secondary | ICD-10-CM

## 2021-02-28 NOTE — Therapy (Addendum)
[facility] [address] [place], [place], [address] Phone: [phone]   Fax:  [phone]  Physical Therapy Treatment  Patient Details  Name: Cathy Yang MRN: [id number] Date of Birth: Aug 24, 1986 Referring Provider (PT): Peggye Form, DO   Encounter Date: 02/28/2021   PT End of Session - 02/28/21 0747    Visit Number 2    Number of Visits 7    Date for PT Re-Evaluation 04/22/21    PT Start Time 0747    PT Stop Time 0825    PT Time Calculation (min) 38 min    Activity Tolerance Patient tolerated treatment well;No increased pain    Behavior During Therapy WFL for tasks assessed/performed           Past Medical History:  Diagnosis Date  . Anemia   . Anxiety   . Back pain   . Chronic cholecystitis   . Depression   . High cholesterol   . Hypertension   . Lactose intolerance   . Nausea & vomiting    PAIN ALSO WITH GALLBALDDER PROBLEM LAST 3 MONTHS  . Scoliosis   . Vitamin D deficiency     Past Surgical History:  Procedure Laterality Date  . CESAREAN SECTION  01/25/2010   X1  . CHOLECYSTECTOMY N/A 07/10/2018   Procedure: LAPAROSCOPIC CHOLECYSTECTOMY WITH INTRAOPERATIVE CHOLANGIOGRAM;  Surgeon: Luretha Murphy, MD;  Location: [facility];  Service: General;  Laterality: N/A;  . FIXATION KYPHOPLASTY  AGE 35  . LAPAROSCOPIC GASTRIC SLEEVE RESECTION N/A 10/08/2017   Procedure: LAPAROSCOPIC GASTRIC SLEEVE RESECTION WITH UPPER ENDOSCOPY;  Surgeon: Luretha Murphy, MD;  Location: [facility];  Service: General;  Laterality: N/A;    There were no vitals filed for this visit.   Subjective Assessment - 02/28/21 0750    Subjective Patient reports same pain, has been compliant with exercises and use of lift.  Lift seems to be helping.  No change with pain.  Patient states she performes HEP at work.    Pain Score 7    No higher since last monday.   Pain Location Back    Pain Type Chronic pain    Aggravating Factors   Standing, bending, lifting    Multiple Pain Sites Yes   B Hips   Pain Score 7    Pain Location Hip    Pain Orientation Right;Left   R worse than L                            [facility] Adult PT Treatment/Exercise - 02/28/21 0001      Lumbar Exercises: Standing   Functional Squats 10 reps   2sets     Shoulder Exercises: Seated   Row 15 reps;Theraband    Theraband Level (Shoulder Row) Level 3 (Green)   3sec hold   Other Seated Exercises Lying on vertical 1/2 foam, abd/add, alt flex   2x10ea     Shoulder Exercises: Sidelying   Other Sidelying Exercises R/L Open book 2x10      Manual Therapy   Manual Therapy Soft tissue mobilization    Manual therapy comments B UTs, levators, mid scap, cervical/upper thor paraspinals.                  PT Education - 02/28/21 0844    Education Details Progressed HEP with addition of open book and shoulder matrix on towel roll along spine, use of heat UTs/upper back.    Person(s)  Educated Patient    Methods Explanation;Demonstration;Handout    Comprehension Verbalized understanding;Returned demonstration            PT Short Term Goals - 02/28/21 0850      PT SHORT TERM GOAL #1   Title Pt will be compliant and knowledgeable with initial HEP in order to improve carryover    Status On-going      PT SHORT TERM GOAL #2   Title Pt will self report mid back pain no greater than 7/10 at worst    Status On-going             PT Long Term Goals - 02/28/21 0851      PT LONG TERM GOAL #1   Title Pt will demonstrate improved posture and verbalize strategies to improve ergonomics in order to decrease pain with work    Status On-going      PT LONG TERM GOAL #2   Title Pt will be compliant with final advanced HEP in order to maintain and continue improvement    Status On-going      PT LONG TERM GOAL #3   Title Pt will self report mid back pain no greater than 4/10 at worst in order to improve comfort and functional ability     Status On-going      PT LONG TERM GOAL #4   Title Pt will improve bilateral UE MMT to no less than 5/5 for all tested motions    Status On-going                 Plan - 02/28/21 0827    Clinical Impression Statement Patient has been compliant with HEP and use of heel lift with some improvement in hip discomfort but no real change to back pain.  Patient education for use of heat for upper back and UTs and improved exercise techniques with verbal cues.  Patient will benefit from continued skilled PT to address deficits and maximzie improved functional activities with less pain.    Personal Factors and Comorbidities Time since onset of injury/illness/exacerbation    Examination-Activity Limitations Squat;Stairs;Carry;Lift    Examination-Participation Restrictions Yard Work;Shop;Occupation;Cleaning;Community Activity    PT Treatment/Interventions ADLs/Self Care Home Management;Electrical Stimulation;Cryotherapy;Moist Heat;Iontophoresis 4mg /ml Dexamethasone;Ultrasound;Gait training;Stair training;Functional mobility training;Therapeutic activities;Therapeutic exercise;Neuromuscular re-education;Manual techniques;Dry needling    PT Next Visit Plan Progress HEP as able with focus on hip issues.    PT Home Exercise Plan Access Code:    Consulted and Agree with Plan of Care Patient           Patient will benefit from skilled therapeutic intervention in order to improve the following deficits and impairments:  Decreased activity tolerance,Decreased endurance,Decreased range of motion,Decreased strength,Impaired UE functional use,Pain,Postural dysfunction  Visit Diagnosis: Muscle weakness (generalized)  Pain in thoracic spine  Pain in right hip     Problem List Patient Active Problem List   Diagnosis Date Noted  . Symptomatic mammary hypertrophy 02/01/2021  . Back pain 02/01/2021  . Neck pain 02/01/2021  . Depression with anxiety 09/06/2020  . Class 3 severe obesity  with serious comorbidity and body mass index (BMI) of 40.0 to 44.9 in adult (HCC) 09/06/2020  . Vitamin D deficiency 08/09/2020  . S/P laparoscopic cholecystectomy Sept 2019 07/10/2018  . 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 [place], [place], [place] Phone: [phone]   Fax:  [phone]  Name: Olamide Lahaie  MRN: [id number] Date of Birth: May 18, 1986

## 2021-02-28 NOTE — Patient Instructions (Addendum)
Supine Shoulder Horizontal Abduction laying on towel roll along spine Then alternate flex/ext  REPS: 15 SETS: 2 HOLD: 10SEC before each set DAILY: 1 WEEKLY: 7  Open book R/L sidelying  REPS: 10 SETS: 2  DAILY: 1  WEEKLY: 7

## 2021-03-07 ENCOUNTER — Ambulatory Visit: Payer: 59 | Admitting: Physical Therapy

## 2021-03-07 ENCOUNTER — Other Ambulatory Visit: Payer: Self-pay

## 2021-03-07 ENCOUNTER — Ambulatory Visit
Admission: RE | Admit: 2021-03-07 | Discharge: 2021-03-07 | Disposition: A | Discharge status: Home / Self Care | Payer: 59 | Source: Ambulatory Visit | Attending: Plastic Surgery | Admitting: Plastic Surgery

## 2021-03-07 ENCOUNTER — Encounter: Payer: Self-pay | Admitting: Physical Therapy

## 2021-03-07 DIAGNOSIS — M25551 Pain in right hip: Secondary | ICD-10-CM

## 2021-03-07 DIAGNOSIS — M6281 Muscle weakness (generalized): Secondary | ICD-10-CM | POA: Diagnosis not present

## 2021-03-07 DIAGNOSIS — Z1231 Encounter for screening mammogram for malignant neoplasm of breast: Secondary | ICD-10-CM

## 2021-03-07 DIAGNOSIS — M546 Pain in thoracic spine: Secondary | ICD-10-CM

## 2021-03-07 NOTE — Therapy (Signed)
[facility] [address] [place], [place], [address] Phone: [phone]   Fax:  [phone]  Physical Therapy Treatment  Patient Details  Name: Cathy Yang MRN: [id number] Date of Birth: 10/28/1985 Referring Provider (PT): Ulice Bold Alena Bills, DO   Encounter Date: 03/07/2021   PT End of Session - 03/07/21 0830    Visit Number 3    Number of Visits 7    Date for PT Re-Evaluation 04/22/21    Authorization Type UHC/[employer]y [employer] MCD - No FOTO    PT Start Time 0748    PT Stop Time 0827    PT Time Calculation (min) 39 min    Activity Tolerance Patient tolerated treatment well;Other (comment)   Pain reduced to 4/10 after treatment.   Behavior During Therapy [facility] for tasks assessed/performed           Past Medical History:  Diagnosis Date  . Anemia   . Anxiety   . Back pain   . Chronic cholecystitis   . Depression   . High cholesterol   . Hypertension   . Lactose intolerance   . Nausea & vomiting    PAIN ALSO WITH GALLBALDDER PROBLEM LAST 3 MONTHS  . Scoliosis   . Vitamin D deficiency     Past Surgical History:  Procedure Laterality Date  . CESAREAN SECTION  01/25/2010   X1  . CHOLECYSTECTOMY N/A 07/10/2018   Procedure: LAPAROSCOPIC CHOLECYSTECTOMY WITH INTRAOPERATIVE CHOLANGIOGRAM;  Surgeon: Luretha Murphy, MD;  Location: [facility];  Service: General;  Laterality: N/A;  . FIXATION KYPHOPLASTY  AGE 30  . LAPAROSCOPIC GASTRIC SLEEVE RESECTION N/A 10/08/2017   Procedure: LAPAROSCOPIC GASTRIC SLEEVE RESECTION WITH UPPER ENDOSCOPY;  Surgeon: Luretha Murphy, MD;  Location: [facility];  Service: General;  Laterality: N/A;    There were no vitals filed for this visit.   Subjective Assessment - 03/07/21 0749    Subjective Patient reports doing the exercises, mostly during the week.  Patient reports mostly the same but a little more comfort in hips (points to SI region) area. Patient reports the HEP does improve the pain for a  short time, but the pain returns quickly and to the same level.    Currently in Pain? Yes    Pain Score 7     Pain Location Back    Pain Orientation Mid    Pain Type Chronic pain    Aggravating Factors  standing, bending, lifting.              [facility] PT Assessment - 03/07/21 0001      Assessment   Medical Diagnosis N62 (ICD-10-CM) - Symptomatic mammary hypertrophy; M54.6,G89.29 (ICD-10-CM) - Chronic bilateral thoracic back pain; M54.2 (ICD-10-CM) - Neck pain    Referring Provider (PT) Dillingham, Alena Bills, DO                         [facility] Adult PT Treatment/Exercise - 03/07/21 0001      Lumbar Exercises: Stretches   Single Knee to Chest Stretch 3 reps;10 seconds      Lumbar Exercises: Standing   Functional Squats 10 reps   Focus on abdom setting     Lumbar Exercises: Supine   Bridge 20 reps    Bridge Limitations B feet on T ball    Other Supine Lumbar Exercises LTR B feet on Tball    Other Supine Lumbar Exercises r/L x10ea dir      Knee/Hip Exercises: Aerobic  Nustep L5 x59min B UE/LE      Shoulder Exercises: Seated   Row 10 reps   2 sets with 5 sec hold   Theraband Level (Shoulder Row) Level 3 (Green)      Shoulder Exercises: Sidelying   Other Sidelying Exercises R/L Open book 2x10      Manual Therapy   Manual Therapy Soft tissue mobilization;Joint mobilization    Manual therapy comments Lumbar paraspinals, PA gides lower thor and lumbar                  PT Education - 03/07/21 0829    Education Details Added standing lumbar extension 6+x/day to HEP.  Ues of heat to calm muscles. Discussed continued POC.    Person(s) Educated Patient    Methods Explanation;Demonstration;Handout    Comprehension Returned demonstration            PT Short Term Goals - 02/28/21 0850      PT SHORT TERM GOAL #1   Title Pt will be compliant and knowledgeable with initial HEP in order to improve carryover    Status On-going      PT SHORT TERM GOAL #2    Title Pt will self report mid back pain no greater than 7/10 at worst    Status On-going             PT Long Term Goals - 02/28/21 0851      PT LONG TERM GOAL #1   Title Pt will demonstrate improved posture and verbalize strategies to improve ergonomics in order to decrease pain with work    Status On-going      PT LONG TERM GOAL #2   Title Pt will be compliant with final advanced HEP in order to maintain and continue improvement    Status On-going      PT LONG TERM GOAL #3   Title Pt will self report mid back pain no greater than 4/10 at worst in order to improve comfort and functional ability    Status On-going      PT LONG TERM GOAL #4   Title Pt will improve bilateral UE MMT to no less than 5/5 for all tested motions    Status On-going                 Plan - 03/07/21 1128    Clinical Impression Statement Patient pain decreased to 4/10 after treatment and states she feels less pressure on her hips.  Patient responded well to standing lumbar extension, this exercises added to HEP for 6 or more x/day and to pay attention to response.  Patient continues to be challenged by pain with activity and pain during prolonged sitting/standing. Patient could benefit from skilled PT to address deficits and progress a plan for pain management and add core stability to reduce pain during daily activity.    Examination-Activity Limitations Squat;Stairs;Carry;Lift    Examination-Participation Restrictions Yard Work;Shop;Occupation;Cleaning;Community Activity    PT Treatment/Interventions ADLs/Self Care Home Management;Electrical Stimulation;Cryotherapy;Moist Heat;Iontophoresis 4mg /ml Dexamethasone;Ultrasound;Gait training;Stair training;Functional mobility training;Therapeutic activities;Therapeutic exercise;Neuromuscular re-education;Manual techniques;Dry needling    PT Next Visit Plan Progress HEP as able with focus on hip issues and core stability.    PT Home Exercise Plan Access  Code:    Consulted and Agree with Plan of Care Patient           Patient will benefit from skilled therapeutic intervention in order to improve the following deficits and impairments:  Decreased activity tolerance,Decreased endurance,Decreased range  of motion,Decreased strength,Impaired UE functional use,Pain,Postural dysfunction  Visit Diagnosis: Muscle weakness (generalized)  Pain in thoracic spine  Pain in right hip     Problem List Patient Active Problem List   Diagnosis Date Noted  . Symptomatic mammary hypertrophy 02/01/2021  . Back pain 02/01/2021  . Neck pain 02/01/2021  . Depression with anxiety 09/06/2020  . Class 3 severe obesity with serious comorbidity and body mass index (BMI) of 40.0 to 44.9 in adult (HCC) 09/06/2020  . Vitamin D deficiency 08/09/2020  . S/P laparoscopic cholecystectomy Sept 2019 07/10/2018  . 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 [place], [place], [address] Phone: [phone]   Fax:  [phone]  Name: SOPHEA RACKHAM MRN: [id number] Date of Birth: Oct 27, 1985

## 2021-03-14 ENCOUNTER — Ambulatory Visit: Payer: 59

## 2021-03-14 ENCOUNTER — Other Ambulatory Visit: Payer: Self-pay

## 2021-03-14 DIAGNOSIS — M25551 Pain in right hip: Secondary | ICD-10-CM

## 2021-03-14 DIAGNOSIS — M546 Pain in thoracic spine: Secondary | ICD-10-CM

## 2021-03-14 DIAGNOSIS — M6281 Muscle weakness (generalized): Secondary | ICD-10-CM | POA: Diagnosis not present

## 2021-03-14 NOTE — Therapy (Addendum)
[place], [place], [address] Phone: [phone]   Fax:  [phone]  Physical Therapy Treatment/DISCHARGE SUMMARY  Patient Details  Name: Cathy Yang MRN: [id number] Date of Birth: 1986/08/17 Referring Provider (PT): Wallace Going, DO   Encounter Date: 03/14/2021   PT End of Session - 03/14/21 0746     Visit Number 4    Number of Visits 7    Date for PT Re-Evaluation 04/22/21    Authorization Type [employer] MCD - No FOTO    PT Start Time 0745    PT Stop Time 0825    PT Time Calculation (min) 40 min    Activity Tolerance Patient tolerated treatment well;Other (comment)   Pain reduced to 4/10 after treatment.   Behavior During Therapy WFL for tasks assessed/performed             Past Medical History:  Diagnosis Date   Anemia    Anxiety    Back pain    Chronic cholecystitis    Depression    High cholesterol    Hypertension    Lactose intolerance    Nausea & vomiting    PAIN ALSO WITH GALLBALDDER PROBLEM LAST 3 MONTHS   Scoliosis    Vitamin D deficiency     Past Surgical History:  Procedure Laterality Date   CESAREAN SECTION  01/25/2010   X1   CHOLECYSTECTOMY N/A 07/10/2018   Procedure: LAPAROSCOPIC CHOLECYSTECTOMY WITH INTRAOPERATIVE CHOLANGIOGRAM;  Surgeon: Johnathan Hausen, MD;  Location: [facility];  Service: General;  Laterality: N/A;   FIXATION KYPHOPLASTY  AGE 35   LAPAROSCOPIC GASTRIC SLEEVE RESECTION N/A 10/08/2017   Procedure: LAPAROSCOPIC GASTRIC SLEEVE RESECTION WITH UPPER ENDOSCOPY;  Surgeon: Johnathan Hausen, MD;  Location: [facility];  Service: General;  Laterality: N/A;    There were no vitals filed for this visit.   Subjective Assessment - 03/14/21 0746     Subjective Pt presents to PT with reports of decreased mid and lower back pain. She has been compliant with her HEP with no adversae effects noted. Pt is ready to begin PT treatment at this time.     Currently in Pain? No/denies    Pain Score 0-No pain                               [facility] Adult PT Treatment/Exercise - 03/14/21 0001       Exercises   Exercises Lumbar      Lumbar Exercises: Stretches   Other Lumbar Stretch Exercise cat cow 2x10      Lumbar Exercises: Seated   Other Seated Lumbar Exercises seated thoracic ext over foam roller 2x15      Shoulder Exercises: Supine   Other Supine Exercises foam roll routine with towels: abd/add, pro/retraction, flex/ext x 10      Shoulder Exercises: Sidelying   Other Sidelying Exercises open book 2x10      Shoulder Exercises: Standing   Row 15 reps   x 2   Other Standing Exercises serratus wall slide with foam x 15      Shoulder Exercises: ROM/Strengthening   UBE (Upper Arm Bike) lvl 2.0 x 5 min (2.32fd/2.5bwd) x 5 min                      PT Short Term Goals - 02/28/21 0850       PT SHORT TERM GOAL #1  Title Pt will be compliant and knowledgeable with initial HEP in order to improve carryover    Status On-going      PT SHORT TERM GOAL #2   Title Pt will self report mid back pain no greater than 7/10 at worst    Status On-going               PT Long Term Goals - 02/28/21 0851       PT LONG TERM GOAL #1   Title Pt will demonstrate improved posture and verbalize strategies to improve ergonomics in order to decrease pain with work    Status On-going      PT LONG TERM GOAL #2   Title Pt will be compliant with final advanced HEP in order to maintain and continue improvement    Status On-going      PT LONG TERM GOAL #3   Title Pt will self report mid back pain no greater than 4/10 at worst in order to improve comfort and functional ability    Status On-going      PT LONG TERM GOAL #4   Title Pt will improve bilateral UE MMT to no less than 5/5 for all tested motions    Status On-going                   Plan - 03/14/21 0746     Clinical Impression Statement Pt  was able to complete prescribed exercises with no adverse effect or increase in pain. She demonstrated reduced thoracic mobility with new exercises working on improving movement in this area. She responded well to new exercises and continues to benefit from skilled PT services. Will continue with POC as prescribed.    Examination-Activity Limitations Squat;Stairs;Carry;Lift    Examination-Participation Restrictions Yard Work;Shop;Occupation;Cleaning;Community Activity    PT Treatment/Interventions ADLs/Self Care Home Management;Electrical Stimulation;Cryotherapy;Moist Heat;Iontophoresis 96m/ml Dexamethasone;Ultrasound;Gait training;Stair training;Functional mobility training;Therapeutic activities;Therapeutic exercise;Neuromuscular re-education;Manual techniques;Dry needling    PT Next Visit Plan Progress HEP as able with focus on hip issues and core stability.    PT Home Exercise Plan Access Code: [email or web address]   Consulted and Agree with Plan of Care Patient             Patient will benefit from skilled therapeutic intervention in order to improve the following deficits and impairments:  Decreased activity tolerance,Decreased endurance,Decreased range of motion,Decreased strength,Impaired UE functional use,Pain,Postural dysfunction  Visit Diagnosis: Muscle weakness (generalized)  Pain in thoracic spine  Pain in right hip     Problem List Patient Active Problem List   Diagnosis Date Noted   Symptomatic mammary hypertrophy 02/01/2021   Back pain 02/01/2021   Neck pain 02/01/2021   Depression with anxiety 09/06/2020   Class 3 severe obesity with serious comorbidity and body mass index (BMI) of 40.0 to 44.9 in adult ([facility] 09/06/2020   Vitamin D deficiency 08/09/2020   S/P laparoscopic cholecystectomy Sept 2019 07/10/2018   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 Patient goals were not met. Patient  is being discharged due to not returning since the last visit. > 1 month   Pollyann Samples, [place]

## 2021-03-23 ENCOUNTER — Ambulatory Visit: Payer: Medicaid Other

## 2021-03-28 ENCOUNTER — Ambulatory Visit: Payer: Medicaid Other | Attending: Plastic Surgery

## 2021-03-28 ENCOUNTER — Telehealth: Payer: Self-pay

## 2021-03-28 NOTE — Telephone Encounter (Signed)
PT called and spoke with patient regarding missed visit. Patient noted that she did not receive a reminder and did not have this appointment on her calender. Reminded her of next visit.  Eloy End, PT, DPT 03/28/21 8:55 AM

## 2021-04-04 ENCOUNTER — Ambulatory Visit: Payer: Medicaid Other

## 2022-02-27 ENCOUNTER — Emergency Department (HOSPITAL_COMMUNITY)
Admission: EM | Admit: 2022-02-27 | Discharge: 2022-02-27 | Disposition: A | Discharge status: Home / Self Care | Payer: 59 | Attending: Emergency Medicine | Admitting: Emergency Medicine

## 2022-02-27 ENCOUNTER — Encounter (HOSPITAL_COMMUNITY): Payer: Self-pay

## 2022-02-27 DIAGNOSIS — R7989 Other specified abnormal findings of blood chemistry: Secondary | ICD-10-CM | POA: Diagnosis not present

## 2022-02-27 DIAGNOSIS — R197 Diarrhea, unspecified: Secondary | ICD-10-CM | POA: Diagnosis not present

## 2022-02-27 DIAGNOSIS — R112 Nausea with vomiting, unspecified: Secondary | ICD-10-CM | POA: Diagnosis not present

## 2022-02-27 DIAGNOSIS — D72819 Decreased white blood cell count, unspecified: Secondary | ICD-10-CM | POA: Diagnosis not present

## 2022-02-27 DIAGNOSIS — R519 Headache, unspecified: Secondary | ICD-10-CM | POA: Diagnosis present

## 2022-02-27 DIAGNOSIS — R1012 Left upper quadrant pain: Secondary | ICD-10-CM | POA: Diagnosis not present

## 2022-02-27 DIAGNOSIS — R1013 Epigastric pain: Secondary | ICD-10-CM | POA: Diagnosis not present

## 2022-02-27 DIAGNOSIS — I10 Essential (primary) hypertension: Secondary | ICD-10-CM | POA: Insufficient documentation

## 2022-02-27 DIAGNOSIS — R1011 Right upper quadrant pain: Secondary | ICD-10-CM | POA: Diagnosis not present

## 2022-02-27 LAB — COMPREHENSIVE METABOLIC PANEL
ALT: 13 U/L (ref 0–44)
AST: 19 U/L (ref 15–41)
Albumin: 4.1 g/dL (ref 3.5–5.0)
Alkaline Phosphatase: 92 U/L (ref 38–126)
Anion gap: 8 (ref 5–15)
BUN: 7 mg/dL (ref 6–20)
CO2: 27 mmol/L (ref 22–32)
Calcium: 9.3 mg/dL (ref 8.9–10.3)
Chloride: 105 mmol/L (ref 98–111)
Creatinine, Ser: 0.62 mg/dL (ref 0.44–1.00)
GFR, Estimated: >60 mL/min (ref >60)
Glucose, Bld: 96 mg/dL (ref 70–99)
Potassium: 3.6 mmol/L (ref 3.5–5.1)
Sodium: 140 mmol/L (ref 135–145)
Total Bilirubin: 1.2 mg/dL (ref 0.3–1.2)
Total Protein: 8.4 g/dL — ABNORMAL HIGH (ref 6.5–8.1)

## 2022-02-27 LAB — CBC WITH DIFFERENTIAL/PLATELET
Abs Immature Granulocytes: 0.01 10*3/uL (ref 0.00–0.07)
Basophils Absolute: 0 10*3/uL (ref 0.0–0.1)
Basophils Relative: 1 %
Eosinophils Absolute: 0 10*3/uL (ref 0.0–0.5)
Eosinophils Relative: 1 %
HCT: 44.8 % (ref 36.0–46.0)
Hemoglobin: 14.6 g/dL (ref 12.0–15.0)
Immature Granulocytes: 0 %
Lymphocytes Relative: 25 %
Lymphs Abs: 0.9 10*3/uL (ref 0.7–4.0)
MCH: 30.2 pg (ref 26.0–34.0)
MCHC: 32.6 g/dL (ref 30.0–36.0)
MCV: 92.6 fL (ref 80.0–100.0)
Monocytes Absolute: 0.3 10*3/uL (ref 0.1–1.0)
Monocytes Relative: 8 %
Neutro Abs: 2.4 10*3/uL (ref 1.7–7.7)
Neutrophils Relative %: 65 %
Platelets: 284 10*3/uL (ref 150–400)
RBC: 4.84 MIL/uL (ref 3.87–5.11)
RDW: 13.9 % (ref 11.5–15.5)
WBC: 3.7 10*3/uL — ABNORMAL LOW (ref 4.0–10.5)
nRBC: 0 % (ref 0.0–0.2)

## 2022-02-27 LAB — URINALYSIS, ROUTINE W REFLEX MICROSCOPIC
Bilirubin Urine: NEGATIVE
Glucose, UA: NEGATIVE mg/dL
Hgb urine dipstick: NEGATIVE
Ketones, ur: NEGATIVE mg/dL
Leukocytes,Ua: NEGATIVE
Nitrite: NEGATIVE
Protein, ur: NEGATIVE mg/dL
Specific Gravity, Urine: 1.01 (ref 1.005–1.030)
pH: 6 (ref 5.0–8.0)

## 2022-02-27 LAB — I-STAT BETA HCG BLOOD, ED (MC, WL, AP ONLY): I-stat hCG, quantitative: <5 m[IU]/mL (ref <5)

## 2022-02-27 LAB — LIPASE, BLOOD: Lipase: 37 U/L (ref 11–51)

## 2022-02-27 MED ORDER — FAMOTIDINE IN NACL 20-0.9 MG/50ML-% IV SOLN
20.0000 mg | Freq: Once | INTRAVENOUS | Status: AC
  Administered 2022-02-27: 20 mg via INTRAVENOUS
  Filled 2022-02-27: qty 50

## 2022-02-27 MED ORDER — FAMOTIDINE 20 MG PO TABS: 20.0000 mg | ORAL_TABLET | Freq: Two times a day (BID) | ORAL | 0 refills | Status: AC | PRN

## 2022-02-27 MED ORDER — LIDOCAINE VISCOUS HCL 2 % MT SOLN
15.0000 mL | Freq: Once | OROMUCOSAL | Status: AC
  Administered 2022-02-27: 15 mL via ORAL
  Filled 2022-02-27: qty 15

## 2022-02-27 MED ORDER — ONDANSETRON 4 MG PO TBDP: 4.0000 mg | ORAL_TABLET | Freq: Three times a day (TID) | ORAL | 0 refills | Status: DC | PRN

## 2022-02-27 MED ORDER — PROCHLORPERAZINE EDISYLATE 10 MG/2ML IJ SOLN
10.0000 mg | Freq: Once | INTRAMUSCULAR | Status: AC
  Administered 2022-02-27: 10 mg via INTRAVENOUS
  Filled 2022-02-27: qty 2

## 2022-02-27 MED ORDER — SODIUM CHLORIDE 0.9 % IV BOLUS
1000.0000 mL | Freq: Once | INTRAVENOUS | Status: AC
  Administered 2022-02-27: 1000 mL via INTRAVENOUS

## 2022-02-27 MED ORDER — DIPHENHYDRAMINE HCL 50 MG/ML IJ SOLN
12.5000 mg | Freq: Once | INTRAMUSCULAR | Status: AC
  Administered 2022-02-27: 12.5 mg via INTRAVENOUS
  Filled 2022-02-27: qty 1

## 2022-02-27 MED ORDER — ALUM & MAG HYDROXIDE-SIMETH 200-200-20 MG/5ML PO SUSP
30.0000 mL | Freq: Once | ORAL | Status: AC
  Administered 2022-02-27: 30 mL via ORAL
  Filled 2022-02-27: qty 30

## 2022-02-27 NOTE — ED Triage Notes (Signed)
Patient arrived with complaints of headache, upper abdominal pain, NVD over the last 48 hours.  ?

## 2022-02-27 NOTE — ED Provider Notes (Signed)
?[facility]-EMERGENCY DEPT ?Provider Note ? ? ?CSN: [id number] ?Arrival date & time: 02/27/22  3329 ? ?  ? ?History ? ?Chief Complaint  ?Patient presents with  ? Abdominal Pain  ? Headache  ? ? ?Cathy Yang is a 36 y.o. female with a history of hypertension, hypercholesterolemia, anxiety, and depression with history of prior cholecystectomy, gastric sleeve resection, C-section who presents to the emergency department with complaints of headache & N/V/D x 2.5 days. Patient reports she developed a gradual onset headache to the right side of her head that has progressively worsened & been constant, hx of similar headaches in the past. Also has had N/V/D, 3-4 episodes of emesis and 3-4 episodes of diarrhea per day. Tried to take tylenol for her headache but vomited right afterwards.  Has diffuse upper abdominal pain, sharp in nature, no alleviating/aggravating factors. She denies fever, change in vision, syncope, numbness, weakness, hematemesis, melena, or dysuria. No recent foreign travel, abx, sick contacts w/ similar, or suspicious PO intake.  ?HPI ? ?  ? ?Home Medications ?Prior to Admission medications   ?Medication Sig Start Date End Date Taking? Authorizing Provider  ?acetaminophen (TYLENOL) 500 MG tablet Take 500 mg by mouth every 6 (six) hours as needed.    [provider]  ?Vitamin D, Ergocalciferol, (DRISDOL) 1.25 MG (50000 UNIT) CAPS capsule Take one tablet every Sunday and take one tablet every Wednesday. 09/06/20   Thomasene Lot, DO  ?   ? ?Allergies    ?Nsaids   ? ?Review of Systems   ?Review of Systems  ?Constitutional:  Negative for chills and fever.  ?Eyes:  Negative for visual disturbance.  ?Respiratory:  Negative for cough and shortness of breath.   ?Cardiovascular:  Negative for chest pain.  ?Gastrointestinal:  Positive for abdominal pain, diarrhea, nausea and vomiting. Negative for blood in stool.  ?Neurological:  Positive for headaches. Negative for syncope,  weakness and numbness.  ?All other systems reviewed and are negative. ? ?Physical Exam ?Updated Vital Signs ?BP (!) 160/106   Pulse 99   Temp 98.2 ?F (36.8 ?C) (Oral)   Resp 18   Ht 5\' 5"  (1.651 m)   Wt 120.2 kg   LMP 02/24/2022   SpO2 99%   BMI 44.10 kg/m?  ?Physical Exam ?Vitals and nursing note reviewed.  ?Constitutional:   ?   General: She is not in acute distress. ?   Appearance: Normal appearance. She is well-developed. She is not toxic-appearing.  ?HENT:  ?   Head: Normocephalic and atraumatic.  ?   Mouth/Throat:  ?   Pharynx: Oropharynx is clear. Uvula midline.  ?Eyes:  ?   General: Vision grossly intact. Gaze aligned appropriately.     ?   Right eye: No discharge.     ?   Left eye: No discharge.  ?   Extraocular Movements: Extraocular movements intact.  ?   Conjunctiva/sclera: Conjunctivae normal.  ?   Pupils: Pupils are equal, round, and reactive to light.  ?   Comments: No proptosis.   ?Cardiovascular:  ?   Rate and Rhythm: Normal rate and regular rhythm.  ?Pulmonary:  ?   Effort: Pulmonary effort is normal. No respiratory distress.  ?   Breath sounds: Normal breath sounds. No wheezing or rales.  ?Abdominal:  ?   General: There is no distension.  ?   Palpations: Abdomen is soft.  ?   Tenderness: There is abdominal tenderness in the right upper quadrant, epigastric area and left upper  quadrant. There is no guarding or rebound.  ?Musculoskeletal:  ?   Cervical back: Normal range of motion and neck supple. No rigidity.  ?Skin: ?   General: Skin is warm and dry.  ?Neurological:  ?   Mental Status: She is alert.  ?   Comments: Alert. Clear speech. No facial droop. CNIII-XII grossly intact. Bilateral upper and lower extremities' sensation grossly intact. 5/5 symmetric strength with grip strength and with plantar and dorsi flexion bilaterally . Normal finger to nose bilaterally. Gait intact.  ?  ?Psychiatric:     ?   Mood and Affect: Mood normal.     ?   Behavior: Behavior normal.  ? ? ?ED Results /  Procedures / Treatments   ?Labs ?(all labs ordered are listed, but only abnormal results are displayed) ?Labs Reviewed  ?COMPREHENSIVE METABOLIC PANEL - Abnormal; Notable for the following components:  ?    Result Value  ? Total Protein 8.4 (*)   ? All other components within normal limits  ?CBC WITH DIFFERENTIAL/PLATELET - Abnormal; Notable for the following components:  ? WBC 3.7 (*)   ? All other components within normal limits  ?LIPASE, BLOOD  ?URINALYSIS, ROUTINE W REFLEX MICROSCOPIC  ?I-STAT BETA HCG BLOOD, ED ([facility], [facility], [facility] ONLY)  ? ? ?EKG ?None ? ?Radiology ?No results found. ? ?Procedures ?Procedures  ? ? ?Medications Ordered in ED ?Medications - No data to display ? ?ED Course/ Medical Decision Making/ A&P ?  ?                        ?Medical Decision Making ?Amount and/or Complexity of Data Reviewed ?Labs: ordered. ? ?Risk ?Prescription drug management. ? ?Patient presents to the ED with complaints of headache w/ N/V/D & abdominal pain. Nontoxic, BP elevated, vitals otherwise unremarkable. No focal neuro deficits, fever, or nuchal rigidity. Upper abdominal TTP, no peritoneal signs. I have ordered migraine cocktail with benadryl & compazine as well as pepcid for abdominal pain and fluids for hydration.  ? ?Additional history obtained:  ?Additional history obtained from chart review & nursing note review.  ? ?Lab Tests:  ?I Ordered, reviewed, and interpreted labs, which included:  ?CBC: Mild leukopenia ?CMP: mild elevation in protein ?Lipase: WNL ?UA: unremarkable ?Preg test: negative ? ?05:20: RE-EVAL: Patient resting comfortably, her headache is fairly resolved, abdomen remains mildly sore, on repeat exam nontender to palpation, no peritoneal signs. Will Give Gi cocktail and PO challenge.  ? ?On repeat abdominal exam patient remains without peritoneal signs, low suspicion for cholecystitis, pancreatitis, diverticulitis, appendicitis, bowel obstruction/perforation,  ectopic pregnancy, or other acute surgical  process. In terms of headache- hx of similar headaches, gradual onset with steady progression in severity, afebrile, no nuchal rigidity, no focal deficits, no visual disturbance, low suspicion for [facility], ICH, ischemic CVA, dural venous sinus thrombosis, acute glaucoma, giant cell arteritis, mass, or meningitis. Patient treated for headache with migraine cocktail with improvement. She is tolerating POI. Will discharge home with supportive measures. I discussed results, treatment plan, need for PCP follow-up, and return precautions with the patient. Provided opportunity for questions, patient confirmed understanding and is in agreement with plan.  ? ?Portions of this note were generated with Scientist, clinical (histocompatibility and immunogenetics). Dictation errors may occur despite best attempts at proofreading. ? ? ?Final Clinical Impression(s) / ED Diagnoses ?Final diagnoses:  ?Acute nonintractable headache, unspecified headache type  ?Nausea vomiting and diarrhea  ? ? ?Rx / DC Orders ?ED Discharge Orders   ? ?  Ordered  ?  ondansetron (ZOFRAN-ODT) 4 MG disintegrating tablet  Every 8 hours PRN       ? 02/27/22 0601  ?  famotidine (PEPCID) 20 MG tablet  2 times daily PRN       ? 02/27/22 0601  ? ?  ?  ? ?  ? ? ?  ?Cherly Andersonetrucelli, Azure Barrales R, PA-C ?02/27/22 [id number] ? ?  ?Paula LibraMolpus, John, MD ?02/27/22 [id number] ? ?

## 2022-02-27 NOTE — Discharge Instructions (Addendum)
You are seen in the emergency department for headache, abdominal pain, vomiting, and diarrhea.  Your labs show that your white blood cell count was very mildly low and your total protein count was mildly elevated, please have these rechecked by your primary care provider.  Your blood work was otherwise very reassuring. ?We are sending you home with the following medications to help with your symptoms:  ?- Pepcid- please take 1 tablet every 12 hours for stomach acidity/pain.  ?- Zofran- please take every 8 hours as needed for nausea/vomiting.  ? ?We have prescribed you new medication(s) today. Discuss the medications prescribed today with your pharmacist as they can have adverse effects and interactions with your other medicines including over the counter and prescribed medications. Seek medical evaluation if you start to experience new or abnormal symptoms after taking one of these medicines, seek care immediately if you start to experience difficulty breathing, feeling of your throat closing, facial swelling, or rash as these could be indications of a more serious allergic reaction ? ? ?Follow attached diet guidelines to help avoid stomach iritation.  ? ?Follow up with your primary care provider within 3 days for re-evaluation.  ?Return to the ER for new or worsening symptoms including but not limited to worsened pain, new pain, inability to keep fluids down, blood in vomit/stool, passing out, numbness, weakness, change in your vision, sudden change in pain, or any other concerns.  ? ?

## 2022-05-11 ENCOUNTER — Encounter (HOSPITAL_COMMUNITY): Payer: Self-pay | Admitting: *Deleted

## 2022-05-31 ENCOUNTER — Encounter (INDEPENDENT_AMBULATORY_CARE_PROVIDER_SITE_OTHER): Payer: Self-pay

## 2022-11-13 ENCOUNTER — Encounter (HOSPITAL_COMMUNITY): Payer: Self-pay | Admitting: Emergency Medicine

## 2022-11-13 ENCOUNTER — Ambulatory Visit (HOSPITAL_COMMUNITY)
Admission: EM | Admit: 2022-11-13 | Discharge: 2022-11-13 | Disposition: A | Discharge status: Home / Self Care | Payer: 59 | Attending: Internal Medicine | Admitting: Internal Medicine

## 2022-11-13 DIAGNOSIS — J069 Acute upper respiratory infection, unspecified: Secondary | ICD-10-CM | POA: Diagnosis not present

## 2022-11-13 MED ORDER — BENZONATATE 200 MG PO CAPS: 200.0000 mg | ORAL_CAPSULE | Freq: Three times a day (TID) | ORAL | 0 refills | Status: AC | PRN

## 2022-11-13 MED ORDER — ONDANSETRON 4 MG PO TBDP: 4.0000 mg | ORAL_TABLET | Freq: Three times a day (TID) | ORAL | 0 refills | Status: AC | PRN

## 2022-11-13 NOTE — Discharge Instructions (Signed)
Please increase oral fluid intake Please take medications as prescribed Your lung exam is reassuring Please take Tylenol as needed for pain and/or fever No indication for COVID or flu or RSV testing given the duration of symptoms Please return to urgent care if you have worsening symptoms

## 2022-11-13 NOTE — ED Triage Notes (Signed)
Pt c/o cough, headache, sore throat, body aches, congestion, sweats since Thursday. Took Theraflu, Nyquil, and Ibuprofen.

## 2022-11-15 NOTE — ED Provider Notes (Signed)
[facility]    CSN: [id number] Arrival date & time: 11/13/22  0858      History   Chief Complaint Chief Complaint  Patient presents with   Cough    HPI Cathy Yang is a 37 y.o. female comes to the urgent care with 4-day history of nonproductive cough, headache, sore throat, generalized bodyaches and chills.  Symptoms started insidiously and has been persistent.  Patient denies any shortness of breath or wheezing.  Patient complains of nausea and a few bouts of diarrhea.  Diarrhea is nonmucoid/non-bloody.  No abdominal distention.  Patient is vaccinated against COVID-19 virus.  Patient reports history of contact with family member with similar symptoms. Dizziness, near syncope or extremities.  Patient works from home  HPI  Past Medical History:  Diagnosis Date   Anemia    Anxiety    Back pain    Chronic cholecystitis    Depression    High cholesterol    Hypertension    Lactose intolerance    Nausea & vomiting    PAIN ALSO WITH GALLBALDDER PROBLEM LAST 3 MONTHS   Scoliosis    Vitamin D deficiency     Patient Active Problem List   Diagnosis Date Noted   Symptomatic mammary hypertrophy 02/01/2021   Back pain 02/01/2021   Neck pain 02/01/2021   Depression with anxiety 09/06/2020   Class 3 severe obesity with serious comorbidity and body mass index (BMI) of 40.0 to 44.9 in adult [facility]) 09/06/2020   Vitamin D deficiency 08/09/2020   S/P laparoscopic cholecystectomy Sept 2019 07/10/2018   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    Past Surgical History:  Procedure Laterality Date   CESAREAN SECTION  01/25/2010   X1   CHOLECYSTECTOMY N/A 07/10/2018   Procedure: LAPAROSCOPIC CHOLECYSTECTOMY WITH INTRAOPERATIVE CHOLANGIOGRAM;  Surgeon: Johnathan Hausen, MD;  Location: [facility];  Service: General;  Laterality: N/A;   FIXATION KYPHOPLASTY  AGE 89   LAPAROSCOPIC GASTRIC SLEEVE RESECTION N/A 10/08/2017   Procedure: LAPAROSCOPIC GASTRIC SLEEVE RESECTION WITH  UPPER ENDOSCOPY;  Surgeon: Johnathan Hausen, MD;  Location: [facility];  Service: General;  Laterality: N/A;    OB History     Gravida  1   Para  1   Term      Preterm      AB      Living         SAB      IAB      Ectopic      Multiple      Live Births               Home Medications    Prior to Admission medications   Medication Sig Start Date End Date Taking? Authorizing Provider  benzonatate (TESSALON) 200 MG capsule Take 1 capsule (200 mg total) by mouth 3 (three) times daily as needed for cough. 11/13/22  Yes Smith Potenza, Myrene Galas, MD  ondansetron (ZOFRAN-ODT) 4 MG disintegrating tablet Take 1 tablet (4 mg total) by mouth every 8 (eight) hours as needed for nausea or vomiting. 11/13/22  Yes Kiyaan Haq, Myrene Galas, MD  tiZANidine (ZANAFLEX) 2 MG tablet Take 2 mg by mouth 3 (three) times daily. 04/30/22  Yes [provider]  acetaminophen (TYLENOL) 500 MG tablet Take 500 mg by mouth every 6 (six) hours as needed.    [provider]  famotidine (PEPCID) 20 MG tablet Take 1 tablet (20 mg total) by mouth 2 (two) times daily as needed for heartburn or indigestion (  abdominal pain). 02/27/22   Petrucelli, Samantha R, PA-C  gabapentin (NEURONTIN) 300 MG capsule Take 600-900 mg by mouth at bedtime.    [provider]    Family History Family History  Problem Relation Age of Onset   Hypertension Mother    Diabetes Mother    Stroke Mother    Kidney disease Mother    Cancer Other     Social History Social History   Tobacco Use   Smoking status: Never   Smokeless tobacco: Never  Vaping Use   Vaping Use: Never used  Substance Use Topics   Alcohol use: Not Currently   Drug use: No     Allergies   Nsaids   Review of Systems Review of Systems As per HPI  Physical Exam Triage Vital Signs ED Triage Vitals [11/13/22 0925]  Enc Vitals Group     BP (!) 146/98     Pulse Rate 74     Resp 18     Temp 99.1 F (37.3 C)     Temp Source Oral      SpO2 99 %     Weight      Height      Head Circumference      Peak Flow      Pain Score 8     Pain Loc      Pain Edu?      Excl. in [place]?    No data found.  Updated Vital Signs BP (!) 146/98 (BP Location: Right Arm)   Pulse 74   Temp 99.1 F (37.3 C) (Oral)   Resp 18   LMP 10/23/2022   SpO2 99%   Visual Acuity Right Eye Distance:   Left Eye Distance:   Bilateral Distance:    Right Eye Near:   Left Eye Near:    Bilateral Near:     Physical Exam Vitals and nursing note reviewed.  Constitutional:      General: She is not in acute distress.    Appearance: She is not ill-appearing.  HENT:     Right Ear: Tympanic membrane normal.     Left Ear: Tympanic membrane normal.     Mouth/Throat:     Mouth: Mucous membranes are moist.     Pharynx: No posterior oropharyngeal erythema.  Cardiovascular:     Rate and Rhythm: Normal rate and regular rhythm.     Pulses: Normal pulses.     Heart sounds: Normal heart sounds.  Pulmonary:     Effort: Pulmonary effort is normal.     Breath sounds: Normal breath sounds.  Abdominal:     General: Bowel sounds are normal.     Palpations: Abdomen is soft.  Neurological:     Mental Status: She is alert.      UC Treatments / Results  Labs (all labs ordered are listed, but only abnormal results are displayed) Labs Reviewed - No data to display  EKG   Radiology No results found.  Procedures Procedures (including critical care time)  Medications Ordered in UC Medications - No data to display  Initial Impression / Assessment and Plan / UC Course  I have reviewed the triage vital signs and the nursing notes.  Pertinent labs & imaging results that were available during my care of the patient were reviewed by me and considered in my medical decision making (see chart for details).     1.  Viral URI with cough: Zofran as needed for nausea Tessalon Perles as needed for  cough Patient's lung exam is reassuring No indication for  COVID or flu testing giving the duration of symptoms Adequate hydration recommended Return precautions given. Final Clinical Impressions(s) / UC Diagnoses   Final diagnoses:  Viral URI with cough     Discharge Instructions      Please increase oral fluid intake Please take medications as prescribed Your lung exam is reassuring Please take Tylenol as needed for pain and/or fever No indication for COVID or flu or RSV testing given the duration of symptoms Please return to urgent care if you have worsening symptoms   ED Prescriptions     Medication Sig Dispense Auth. Provider   ondansetron (ZOFRAN-ODT) 4 MG disintegrating tablet Take 1 tablet (4 mg total) by mouth every 8 (eight) hours as needed for nausea or vomiting. 20 tablet Wanell Lorenzi, Britta Mccreedy, MD   benzonatate (TESSALON) 200 MG capsule Take 1 capsule (200 mg total) by mouth 3 (three) times daily as needed for cough. 30 capsule Kieana Livesay, Britta Mccreedy, MD      PDMP not reviewed this encounter.   Merrilee Jansky, MD 11/15/22 [phone]

## 2023-03-03 IMAGING — MG MM DIGITAL SCREENING BILAT W/ TOMO AND CAD
8 of 16 series · 8 of 40 positions shown · non-contrast
Comparison: None.

ACR Breast Density Category a: The breast tissue is almost entirely
fatty.

CLINICAL DATA: Screening. Scheduled for breast reduction surgery.

EXAM:
DIGITAL SCREENING BILATERAL MAMMOGRAM WITH TOMOSYNTHESIS AND CAD
TECHNIQUE: Bilateral screening digital craniocaudal and mediolateral oblique
mammograms were obtained. Bilateral screening digital breast
tomosynthesis was performed. The images were evaluated with
computer-aided detection.

[R CV synth-2D]
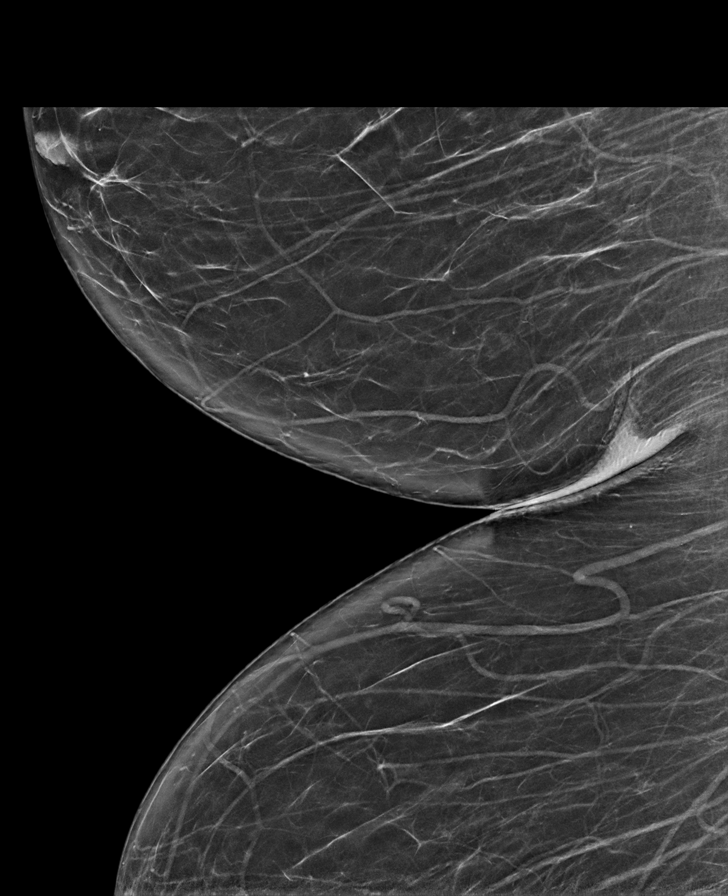

[L CC synth-2D]
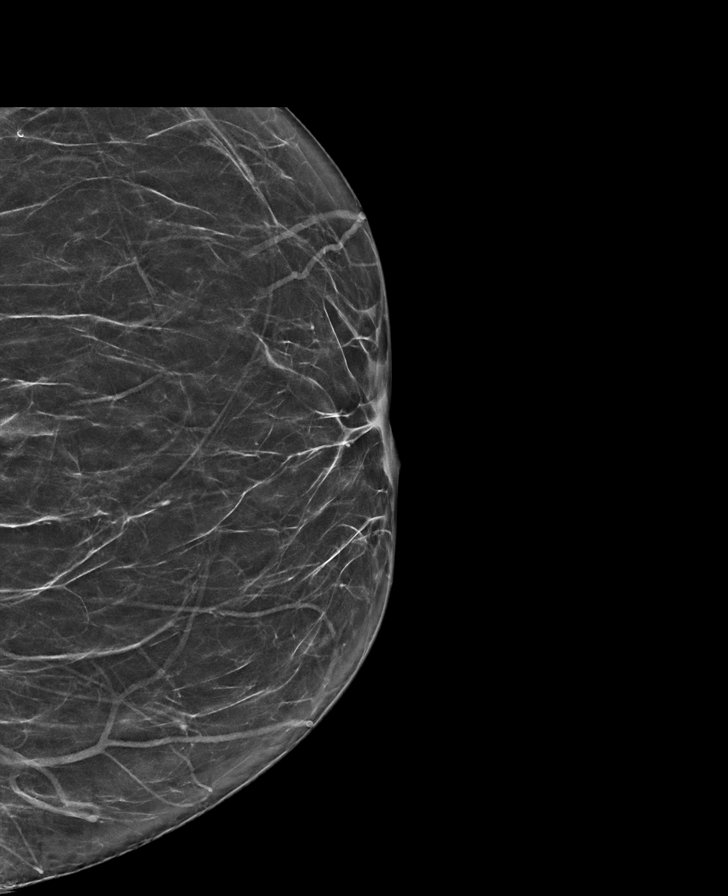

[R MLO synth-2D (1 of 2)]
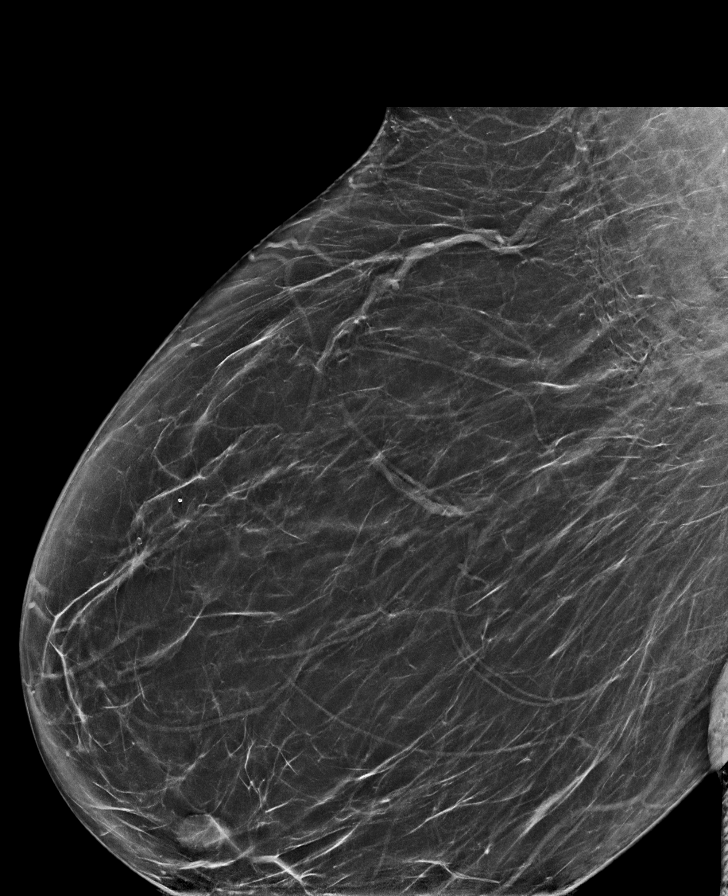

[R MLO synth-2D (2 of 2)]
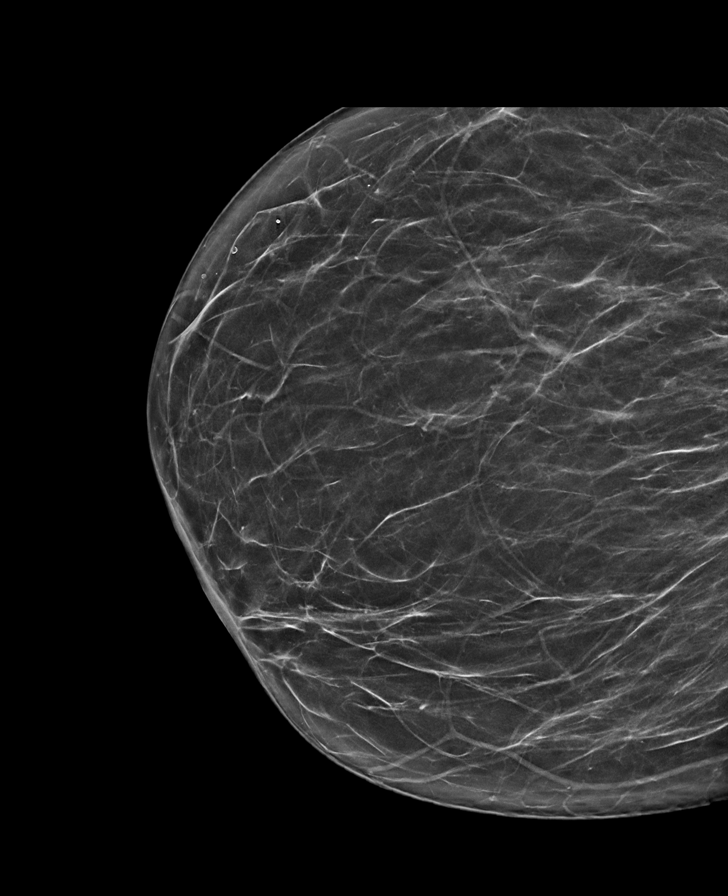

[R CC synth-2D (1 of 2)]
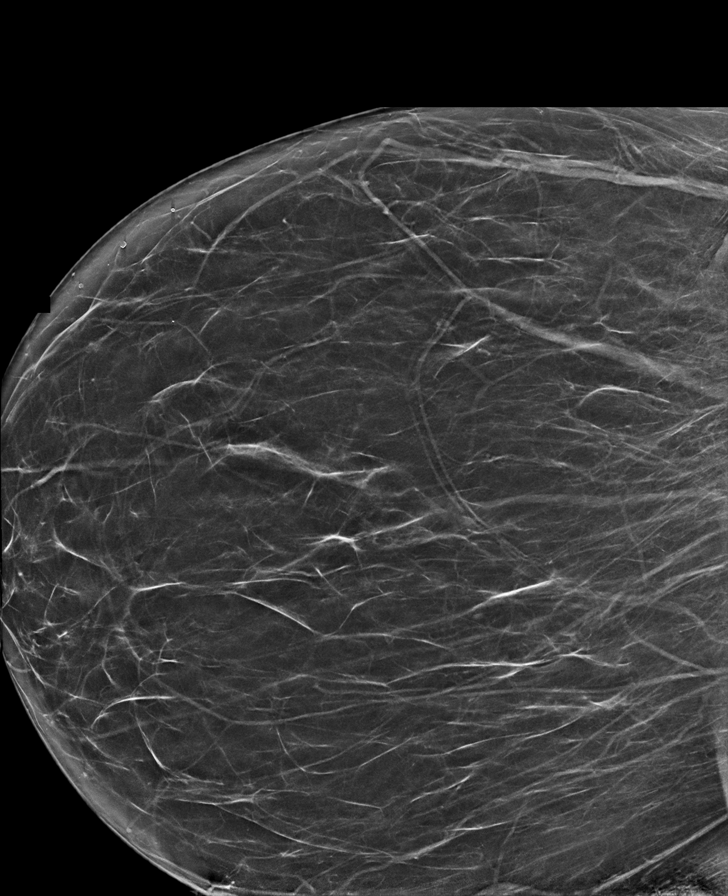

[R CC synth-2D (2 of 2)]
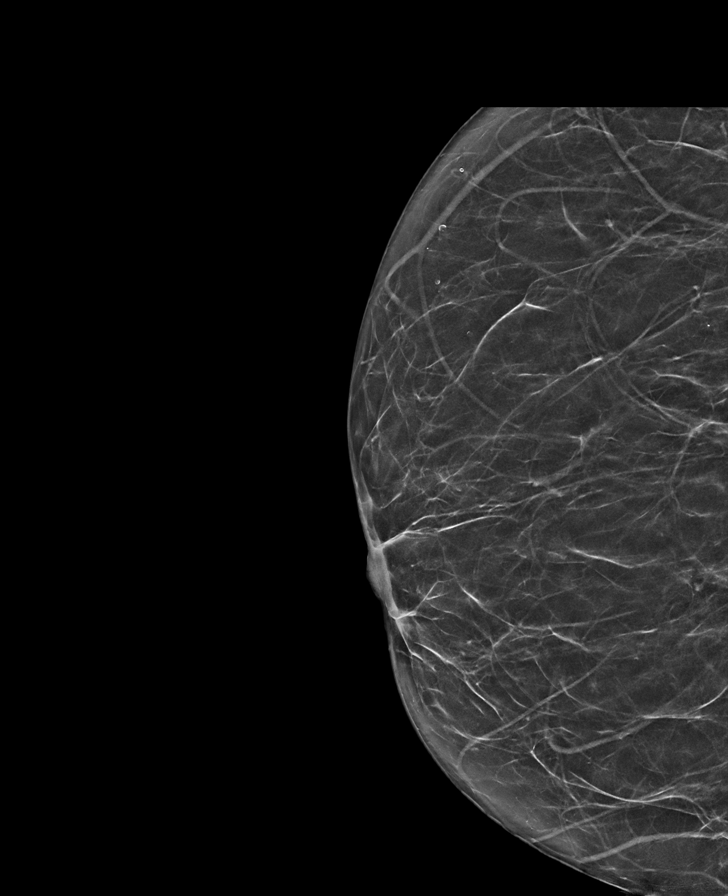

[L MLO synth-2D]
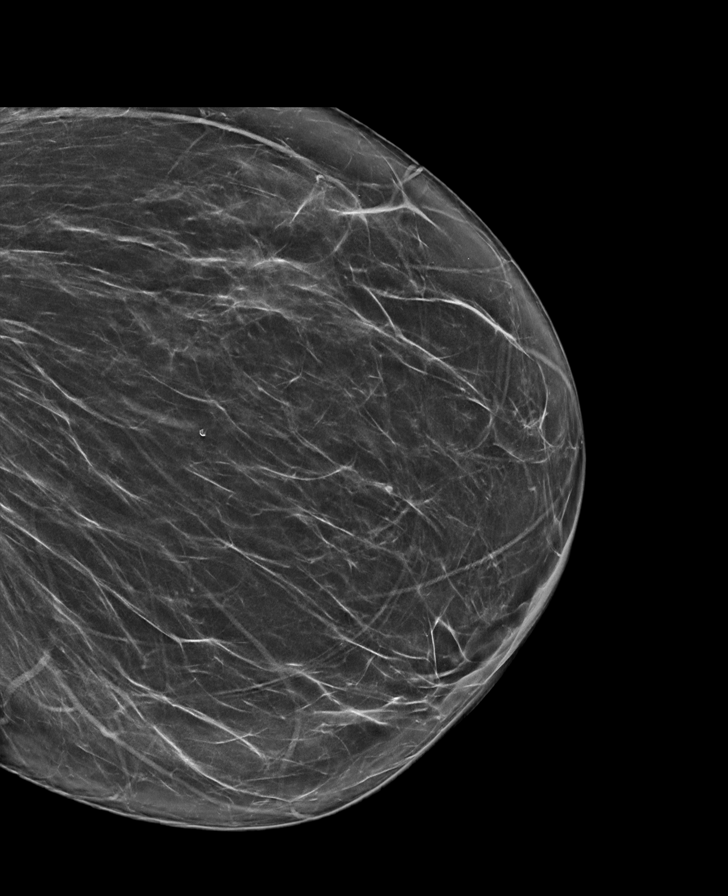

[R CC tomo · tomo slice 32/63.0]
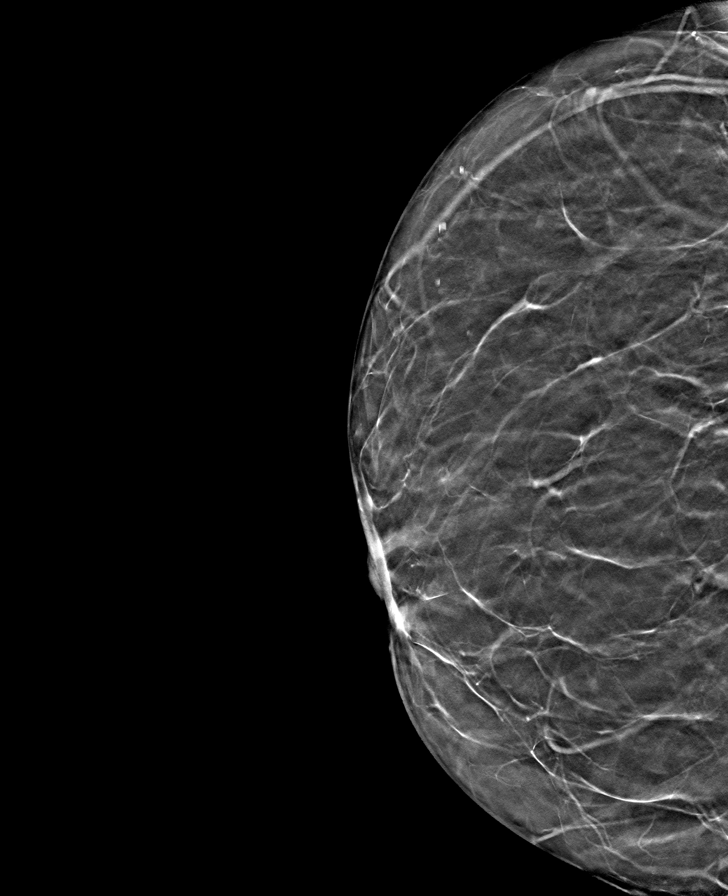

[8 of 40 positions shown; findings below may reference images not displayed]

FINDINGS: There are no findings suspicious for malignancy. The images were
evaluated with computer-aided detection.
IMPRESSION: No mammographic evidence of malignancy. A result letter of this
screening mammogram will be mailed directly to the patient.

RECOMMENDATION:
Screening mammogram at age 41. (Code:J3-E-3D8)

BI-RADS CATEGORY  1: Negative.

## 2023-07-26 NOTE — Progress Notes (Signed)
 Cathy Yang [id number]   Referring Provider:  Self   Subjective   Chief Complaint: Return Weight Loss     History of Present Illness:    This is a very pleasant 37 year old woman who underwent a laparoscopic sleeve gastrectomy on 10/08/2017 with Dr. Gladis for morbid obesity, and subsequent laparoscopic cholecystectomy in September 2019. She was last seen by Dr. Gladis 1 year ago at which point she was 64 pounds down from her preop weight but regaining, and asking about revisions. He recommended phentermine and avoidance of processed carbohydrates and requested a 47-month follow-up. Phentermine unfortunately causes her to have headaches.  Over the last couple of years she has had several stressful events with loss of family members and issues with her mom and has continued to struggle with recurrent obesity. She is now getting back into her routine.  She got married in 2022 and they are hopeful to have children.  She has met with her fertility specialist who advised weight loss.  She is motivated to treat her obesity aggressively in order to improve her chances of conception and healthy pregnancy as well as to improve her overall quality of life.   Review of Systems: A complete review of systems was obtained from the patient.  I have reviewed this information and discussed as appropriate with the patient.  See HPI as well for other ROS.   Medical History: Past Medical History:  Diagnosis Date  . GERD (gastroesophageal reflux disease)     Patient Active Problem List  Diagnosis  . Back pain  . Class 3 severe obesity with serious comorbidity and body mass index (BMI) of 40.0 to 44.9 in adult (CMS/HHS-HCC)  . Depression with anxiety  . Neck pain  . S/P laparoscopic cholecystectomy  . S/P laparoscopic sleeve gastrectomy  . Symptomatic mammary hypertrophy  . Vitamin D  deficiency    Past Surgical History:  Procedure Laterality Date  . LAPAROSCOPIC SLEEVE GASTRECTOMY N/A  10/08/2017  . CESAREAN SECTION    . CHOLECYSTECTOMY       Allergies  Allergen Reactions  . Nsaids (Non-Steroidal Anti-Inflammatory Drug) Other (See Comments)    Unable to take due to gastric sleeve    Current Outpatient Medications on File Prior to Visit  Medication Sig Dispense Refill  . gabapentin  (NEURONTIN ) 300 MG capsule 2-3 BY MOUTH AT BEDTIME FOR BACK PAIN    . tiZANidine (ZANAFLEX) 2 MG tablet Take 2 mg by mouth 3 (three) times daily as needed     No current facility-administered medications on file prior to visit.    Family History  Problem Relation Age of Onset  . Stroke Mother      Social History   Tobacco Use  Smoking Status Never  Smokeless Tobacco Never     Social History   Socioeconomic History  . Marital status: Married  Tobacco Use  . Smoking status: Never  . Smokeless tobacco: Never  Vaping Use  . Vaping status: Never Used  Substance and Sexual Activity  . Alcohol use: Not Currently  . Drug use: Never    Objective:    Vitals:   07/26/23 0915  BP: 132/86  Pulse: (!) 124  Temp: 36.8 C (98.2 F)  SpO2: 98%  Weight: (!) 148.2 kg (326 lb 12.8 oz)  Height: 165.1 cm (5' 5)  PainSc: 0-No pain    Body mass index is 54.38 kg/m.  Gen: A&Ox3, no distress  Unlabored respirations  Assessment and Plan:  Diagnoses and all orders  for this visit:  S/P laparoscopic sleeve gastrectomy -     Ambulatory Referral to Adult Lifestyle/Weight Management Program -     Ambulatory Referral to Nutrition  Morbid obesity (CMS/HHS-HCC) -     Ambulatory Referral to Adult Lifestyle/Weight Management Program -     Ambulatory Referral to Nutrition  Vitamin D  deficiency  Essential hypertension  Anxiety and depression     Recurrent obesity, now 6 years status post laparoscopic sleeve gastrectomy.  We discussed that this is a not uncommon issue especially after going through multiple stressful episodes over the last couple of years.  I discussed options  going forward including first meeting with dietitians to calibrate her eating patterns, as well as consultation with obesity medicine to consider adjuvant medication therapy.  We also briefly discussed conversion to duodenal switch which I would be happy to refer her out to have a conversation about however I think given her desire is to become pregnant at this point dietary and medication therapy would be the safest approach.  She is agreeable.  10/08/17 (surgery date): 342.4lb 07/12/22: 282.2lb 07/26/23: 326.8lb BMI 30: 180lb   CHELSEA ALAN FREUND, MD

## 2023-08-16 ENCOUNTER — Ambulatory Visit: Payer: 59 | Admitting: Dietician

## 2023-09-18 ENCOUNTER — Encounter: Payer: 59 | Attending: Surgery | Admitting: Dietician

## 2023-09-18 ENCOUNTER — Encounter: Payer: Self-pay | Admitting: Dietician

## 2023-09-18 VITALS — Ht 65.0 in | Wt 337.2 lb

## 2023-09-18 DIAGNOSIS — Z713 Dietary counseling and surveillance: Secondary | ICD-10-CM | POA: Insufficient documentation

## 2023-09-18 DIAGNOSIS — E669 Obesity, unspecified: Secondary | ICD-10-CM | POA: Insufficient documentation

## 2023-09-18 DIAGNOSIS — Z6841 Body Mass Index (BMI) 40.0 and over, adult: Secondary | ICD-10-CM | POA: Insufficient documentation

## 2023-09-18 NOTE — Progress Notes (Signed)
Bariatric Nutrition Follow-Up Visit Medical Nutrition Therapy  Appt Start Time: 8:00   End Time: 9:02  Post-Operative Sleeve Gastrectomy Surgery Surgery Date: 10/08/2017  NUTRITION ASSESSMENT   Anthropometrics  Start weight at [facility]: 338.4 lbs (date: 04/24/2017) Height: 65 in Today's weight: 337.2 lbs  Body Composition Scale 10/18/2023  Weight  lbs 337.2  Total Body Fat  % 50.4     Visceral Fat 20  Fat-Free Mass  % 49.5     Total Body Water  % 39.2     Muscle-Mass  lbs 34.4  BMI 55.8  Body Fat Displacement ---        Torso  lbs 105.7        Left Leg  lbs 21.1        Right Leg  lbs 21.1        Left Arm  lbs 10.5        Right Arm  lbs 10.5   Clinical  Medical hx: obesity Medications: gabapentin  Labs: Labs taken at PCP recently, but not in Epic (pt states A1c is up and iron is low)   Lifestyle & Dietary Hx  Pt states her A1c is up and iron is low.  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 Pt states as soon as she eats she has to go to the bathroom. Pt states weight regain started in 2023 after she got married in 2022. Pt states she knows what to do, she just needs to jump start it.  Estimated daily fluid intake: appox. 48 oz Estimated daily protein intake: ? grams (pt states she is not tracking) Supplements: women's multivitamin Current average weekly physical activity: ADLs   24-Hr Dietary Recall First Meal: coffee, water or skip Snack: apple  Second Meal: skip Snack:   Third Meal: spaghetti or Cava (half bowl) or chicken or salmon or steak with spinach or asparagus and rice Snack: bottle of juice Beverages: water, juice (cran-grape), coffee  Post-Op Goals/ Signs/ Symptoms Using straws: no Drinking while eating: no Chewing/swallowing difficulties: no Changes in vision: no Changes to mood/headaches: no Hair loss/changes to skin/nails: no Difficulty focusing/concentrating:  no Sweating: no Limb weakness: no Dizziness/lightheadedness: no Palpitations: no  Carbonated/caffeinated beverages: yes, coffee N/V/D/C/Gas: no Abdominal pain: no Dumping syndrome: no    NUTRITION DIAGNOSIS  Overweight/obesity ([place]-3.3) related to past poor dietary habits and physical inactivity as evidenced by completed bariatric surgery and following dietary guidelines for continued weight loss and healthy nutrition status.     NUTRITION INTERVENTION Nutrition counseling (C-1) and education (E-2) to facilitate bariatric surgery goals, including: The importance of consuming adequate calories as well as certain nutrients daily due to the body's need for essential vitamins, minerals, and fats The importance of daily physical activity and to reach a goal of at least 150 minutes of moderate to vigorous physical activity weekly (or as directed by their physician) due to benefits such as increased musculature and improved lab values The importance of intuitive eating specifically learning hunger-satiety cues and understanding the importance of learning a new body: The importance of mindful eating to avoid grazing behaviors Encouraged patient to honor their body's internal hunger and fullness cues.  Throughout the day, check in mentally and rate hunger. Stop eating when satisfied not full regardless of how much food is left on the plate.  Get more if still hungry 20-30 minutes later.  The key is to  honor satisfaction so throughout the meal, rate fullness factor and stop when comfortably satisfied not physically full. The key is to honor hunger and fullness without any feelings of guilt or shame.  Pay attention to what the internal cues are, rather than any external factors. This will enhance the confidence you have in listening to your own body and following those internal cues enabling you to increase how often you eat when you are hungry not out of appetite and stop when you are satisfied not full.   Encouraged pt to continue to eat balanced meals inclusive of non starchy vegetables 2 times a day 7 days a week Encouraged pt to choose lean protein sources: limiting beef, pork, sausage, hotdogs, and lunch meat Encourage pt to choose healthy fats such as plant based limiting animal fats Encouraged pt to continue to drink a minium 64 fluid ounces with half being plain water to satisfy proper hydration     Goals Increase physical activity; treadmill, bike, stairs, 3 days a week, 30 minutes per day (check out the BELT program) Increase fluid intake; aim for 64 oz per day Eat scheduled meals and snacks; aim for every 3-5 hours; avoid skipping meals  Handouts Provided Include  Standard Prep Plan Advancement Guide  Learning Style & Readiness for Change Teaching method utilized: Visual & Auditory  Demonstrated degree of understanding via: Teach Back  Readiness Level: preparation Barriers to learning/adherence to lifestyle change: returned back to old habits after bariatric surgery.  RD's Notes for Next Visit Assess adherence to pt chosen goals  MONITORING & EVALUATION Dietary intake, weekly physical activity, body weight.  Next Steps Patient is to follow-up in 3 months.

## 2023-10-01 ENCOUNTER — Encounter (HOSPITAL_COMMUNITY): Payer: Self-pay

## 2023-10-01 ENCOUNTER — Ambulatory Visit (HOSPITAL_COMMUNITY)
Admission: EM | Admit: 2023-10-01 | Discharge: 2023-10-01 | Disposition: A | Discharge status: Home / Self Care | Payer: 59 | Attending: Internal Medicine | Admitting: Internal Medicine

## 2023-10-01 DIAGNOSIS — G43009 Migraine without aura, not intractable, without status migrainosus: Secondary | ICD-10-CM

## 2023-10-01 DIAGNOSIS — Z3202 Encounter for pregnancy test, result negative: Secondary | ICD-10-CM | POA: Diagnosis not present

## 2023-10-01 LAB — POCT URINE PREGNANCY: Preg Test, Ur: NEGATIVE

## 2023-10-01 MED ORDER — METOCLOPRAMIDE HCL 5 MG/ML IJ SOLN
INTRAMUSCULAR | Status: AC
  Filled 2023-10-01: qty 2

## 2023-10-01 MED ORDER — DEXAMETHASONE SODIUM PHOSPHATE 10 MG/ML IJ SOLN
INTRAMUSCULAR | Status: AC
  Filled 2023-10-01: qty 1

## 2023-10-01 MED ORDER — KETOROLAC TROMETHAMINE 30 MG/ML IJ SOLN
30.0000 mg | Freq: Once | INTRAMUSCULAR | Status: AC
  Administered 2023-10-01: 30 mg via INTRAMUSCULAR

## 2023-10-01 MED ORDER — KETOROLAC TROMETHAMINE 30 MG/ML IJ SOLN
INTRAMUSCULAR | Status: AC
  Filled 2023-10-01: qty 1

## 2023-10-01 MED ORDER — DEXAMETHASONE SODIUM PHOSPHATE 10 MG/ML IJ SOLN
10.0000 mg | Freq: Once | INTRAMUSCULAR | Status: AC
  Administered 2023-10-01: 10 mg via INTRAMUSCULAR

## 2023-10-01 MED ORDER — METOCLOPRAMIDE HCL 5 MG/ML IJ SOLN
5.0000 mg | Freq: Once | INTRAMUSCULAR | Status: AC
  Administered 2023-10-01: 5 mg via INTRAMUSCULAR

## 2023-10-01 NOTE — Discharge Instructions (Signed)

## 2023-10-01 NOTE — ED Provider Notes (Signed)
[facility]    CSN: [id number] Arrival date & time: 10/01/23  1628      History   Chief Complaint Chief Complaint  Patient presents with   Headache    HPI Cathy Yang is a 37 y.o. female.   Patient presents to urgent care for evaluation of headache that started 4 days ago. Headache is localized to the frontal aspect of the right forehead and the right eye, described as a throbbing sensation, and is currently an 8/10.  Headache has been persistent and has not improved since onset.  Developed nausea today without vomiting.  Reports photophobia and phonophobia. Wears glasses for vision correction, denies recent change in prescription/eye pain. Denies dizziness, vision changes, paresthesias, extremity weakness, memory changes, history of concussion, recent head trauma/injuries, nasal congestion, viral URI symptoms, neck pain, fevers, chills, and history of migraines. Taking OTC tylenol and ibuprofen without relief. Last dose of ibuprofen was greater than 8 hours ago.  Of note, patient is actively trying to become pregnant. LMP 09/14/2023. Unsure of pregnancy status.    Headache   Past Medical History:  Diagnosis Date   Anemia    Anxiety    Back pain    Chronic cholecystitis    Depression    High cholesterol    Hypertension    Lactose intolerance    Nausea & vomiting    PAIN ALSO WITH GALLBALDDER PROBLEM LAST 3 MONTHS   Scoliosis    Vitamin D deficiency     Patient Active Problem List   Diagnosis Date Noted   Symptomatic mammary hypertrophy 02/01/2021   Back pain 02/01/2021   Neck pain 02/01/2021   Depression with anxiety 09/06/2020   Class 3 severe obesity with serious comorbidity and body mass index (BMI) of 40.0 to 44.9 in adult [facility]) 09/06/2020   Vitamin D deficiency 08/09/2020   S/P laparoscopic cholecystectomy Sept 2019 07/10/2018   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    Past Surgical History:  Procedure Laterality Date   CESAREAN  SECTION  01/25/2010   X1   CHOLECYSTECTOMY N/A 07/10/2018   Procedure: LAPAROSCOPIC CHOLECYSTECTOMY WITH INTRAOPERATIVE CHOLANGIOGRAM;  Surgeon: Luretha Murphy, MD;  Location: [facility];  Service: General;  Laterality: N/A;   FIXATION KYPHOPLASTY  AGE 36   LAPAROSCOPIC GASTRIC SLEEVE RESECTION N/A 10/08/2017   Procedure: LAPAROSCOPIC GASTRIC SLEEVE RESECTION WITH UPPER ENDOSCOPY;  Surgeon: Luretha Murphy, MD;  Location: [facility];  Service: General;  Laterality: N/A;    OB History     Gravida  1   Para  1   Term      Preterm      AB      Living         SAB      IAB      Ectopic      Multiple      Live Births               Home Medications    Prior to Admission medications   Medication Sig Start Date End Date Taking? Authorizing Provider  acetaminophen (TYLENOL) 500 MG tablet Take 500 mg by mouth every 6 (six) hours as needed.    [provider]  benzonatate (TESSALON) 200 MG capsule Take 1 capsule (200 mg total) by mouth 3 (three) times daily as needed for cough. 11/13/22   Merrilee Jansky, MD  famotidine (PEPCID) 20 MG tablet Take 1 tablet (20 mg total) by mouth 2 (two) times daily as needed for heartburn  or indigestion (abdominal pain). 02/27/22   Petrucelli, Samantha R, PA-C  gabapentin (NEURONTIN) 300 MG capsule Take 600-900 mg by mouth at bedtime.    [provider]  ondansetron (ZOFRAN-ODT) 4 MG disintegrating tablet Take 1 tablet (4 mg total) by mouth every 8 (eight) hours as needed for nausea or vomiting. 11/13/22   Lamptey, Britta Mccreedy, MD  tiZANidine (ZANAFLEX) 2 MG tablet Take 2 mg by mouth 3 (three) times daily. 04/30/22   [provider]    Family History Family History  Problem Relation Age of Onset   Hypertension Mother    Diabetes Mother    Stroke Mother    Kidney disease Mother    Cancer Other     Social History Social History   Tobacco Use   Smoking status: Never   Smokeless tobacco: Never  Vaping Use   Vaping  status: Never Used  Substance Use Topics   Alcohol use: Not Currently   Drug use: No     Allergies   Patient has no known allergies.   Review of Systems Review of Systems  Neurological:  Positive for headaches.  Per HPI   Physical Exam Triage Vital Signs ED Triage Vitals  Encounter Vitals Group     BP 10/01/23 1814 (!) 167/86     Systolic BP Percentile --      Diastolic BP Percentile --      Pulse Rate 10/01/23 1814 79     Resp 10/01/23 1814 18     Temp 10/01/23 1814 98.5 F (36.9 C)     Temp Source 10/01/23 1814 Oral     SpO2 10/01/23 1814 99 %     Weight --      Height --      Head Circumference --      Peak Flow --      Pain Score 10/01/23 1811 8     Pain Loc --      Pain Education --      Exclude from Growth Chart --    No data found.  Updated Vital Signs BP (!) 167/86 (BP Location: Left Arm)   Pulse 79   Temp 98.5 F (36.9 C) (Oral)   Resp 18   LMP 09/14/2023 (Exact Date)   SpO2 99%   Visual Acuity Right Eye Distance:   Left Eye Distance:   Bilateral Distance:    Right Eye Near:   Left Eye Near:    Bilateral Near:     Physical Exam Vitals and nursing note reviewed.  Constitutional:      Appearance: She is not ill-appearing or toxic-appearing.  HENT:     Head: Normocephalic and atraumatic.     Right Ear: Hearing and external ear normal.     Left Ear: Hearing and external ear normal.     Nose: Nose normal.     Mouth/Throat:     Lips: Pink.  Eyes:     General: Lids are normal. Vision grossly intact. Gaze aligned appropriately.     Extraocular Movements: Extraocular movements intact.     Conjunctiva/sclera: Conjunctivae normal.  Cardiovascular:     Rate and Rhythm: Normal rate and regular rhythm.     Heart sounds: Normal heart sounds, S1 normal and S2 normal.  Pulmonary:     Effort: Pulmonary effort is normal. No respiratory distress.     Breath sounds: Normal breath sounds and air entry.  Musculoskeletal:     Cervical back: Neck  supple.  Skin:  General: Skin is warm and dry.     Capillary Refill: Capillary refill takes less than 2 seconds.     Findings: No rash.  Neurological:     General: No focal deficit present.     Mental Status: She is alert and oriented to person, place, and time. Mental status is at baseline.     Cranial Nerves: Cranial nerves 2-12 are intact. No dysarthria or facial asymmetry.     Sensory: Sensation is intact.     Motor: Motor function is intact.     Coordination: Coordination is intact.     Gait: Gait is intact.     Comments: Strength and sensation intact to bilateral upper and lower extremities (5/5). Moves all 4 extremities with normal coordination voluntarily. Non-focal neuro exam.   Psychiatric:        Mood and Affect: Mood normal.        Speech: Speech normal.        Behavior: Behavior normal.        Thought Content: Thought content normal.        Judgment: Judgment normal.      UC Treatments / Results  Labs (all labs ordered are listed, but only abnormal results are displayed) Labs Reviewed  POCT URINE PREGNANCY    EKG   Radiology No results found.  Procedures Procedures (including critical care time)  Medications Ordered in UC Medications  ketorolac (TORADOL) 30 MG/ML injection 30 mg (30 mg Intramuscular Given 10/01/23 1926)  metoCLOPramide (REGLAN) injection 5 mg (5 mg Intramuscular Given 10/01/23 1927)  dexamethasone (DECADRON) injection 10 mg (10 mg Intramuscular Given 10/01/23 1926)    Initial Impression / Assessment and Plan / UC Course  I have reviewed the triage vital signs and the nursing notes.  Pertinent labs & imaging results that were available during my care of the patient were reviewed by me and considered in my medical decision making (see chart for details).   1. Migraine without aura and without status migrainosus, negative pregnancy test Evaluation suggests migraine type headache. Patient has never had migraine in the past. Neurologic  exam without focal deficit, patient intact to baseline.  Nursing administered headache cocktail in clinic for headache with some relief prior to discharge.  May use OTC medications as needed for further head pain relief.  Encouraged to drink plenty of fluids to stay well hydrated. Follow-up with PCP as needed for further evaluation of persistent headaches. Neurology referral may be beneficial in future should headaches worsen or become more persistent.  Pregnancy test negative prior to administration of medications.  Counseled patient on potential for adverse effects with medications prescribed/recommended today, strict ER and return-to-clinic precautions discussed, patient verbalized understanding.    Final Clinical Impressions(s) / UC Diagnoses   Final diagnoses:  Migraine without aura and without status migrainosus, not intractable  Negative pregnancy test     Discharge Instructions      You have been evaluated today for headache.  You were given medicines for your headache in the clinic today which included a strong NSAID, so do not  take ibuprofen or other NSAIDS (Aleve, aspirin, naproxen, ibuprofen, goody powder, etc.) for the next 12 hours.  Starting tomorrow, take 600mg  ibuprofen every 6 hours or tylenol 1,000 every 6 hours as needed for pain.  Avoid areas of loud noise/harsh light and remember to drink plenty of water to stay well hydrated.  Please follow up with your primary care provider for further management of your headaches.  Please  seek emergency medical care if you experience worsening or uncontrolled pain, vision changes, recurrent vomiting, difficulty with normal activities, abnormal behavior, difficulty walking, numbness, weakness, or any other concerning symptoms.      ED Prescriptions   None    PDMP not reviewed this encounter.   Carlisle Beers, [place] 10/01/23 1941

## 2023-10-01 NOTE — ED Triage Notes (Signed)
Pt present to urgent care for a "lingering headache for about four days now with nausea that just started today." Pt states she has tried to alternate Tylenol and Ibuprofen but reports no pain relief with either. Pt does state that she works from home on a computer, "eyes have been extra sensitive to light." Pt currently rates her headache pain an 8/10, describes as throbbing.

## 2023-11-26 ENCOUNTER — Ambulatory Visit: Payer: 59 | Admitting: Dietician

## 2024-08-20 ENCOUNTER — Other Ambulatory Visit: Payer: Self-pay

## 2024-08-20 ENCOUNTER — Emergency Department (HOSPITAL_COMMUNITY)

## 2024-08-20 ENCOUNTER — Emergency Department (HOSPITAL_COMMUNITY)
Admission: EM | Admit: 2024-08-20 | Discharge: 2024-08-20 | Disposition: A | Discharge status: Home / Self Care | Attending: Emergency Medicine | Admitting: Emergency Medicine

## 2024-08-20 ENCOUNTER — Encounter (HOSPITAL_COMMUNITY): Payer: Self-pay

## 2024-08-20 DIAGNOSIS — I10 Essential (primary) hypertension: Secondary | ICD-10-CM | POA: Diagnosis not present

## 2024-08-20 DIAGNOSIS — R1084 Generalized abdominal pain: Secondary | ICD-10-CM | POA: Diagnosis present

## 2024-08-20 DIAGNOSIS — R112 Nausea with vomiting, unspecified: Secondary | ICD-10-CM | POA: Diagnosis not present

## 2024-08-20 LAB — CBC WITH DIFFERENTIAL/PLATELET
Abs Immature Granulocytes: 0.02 K/uL (ref 0.00–0.07)
Basophils Absolute: 0 K/uL (ref 0.0–0.1)
Basophils Relative: 0 %
Eosinophils Absolute: 0 K/uL (ref 0.0–0.5)
Eosinophils Relative: 0 %
HCT: 40.5 % (ref 36.0–46.0)
Hemoglobin: 12.9 g/dL (ref 12.0–15.0)
Immature Granulocytes: 0 %
Lymphocytes Relative: 17 %
Lymphs Abs: 1.3 K/uL (ref 0.7–4.0)
MCH: 29.3 pg (ref 26.0–34.0)
MCHC: 31.9 g/dL (ref 30.0–36.0)
MCV: 91.8 fL (ref 80.0–100.0)
Monocytes Absolute: 0.6 K/uL (ref 0.1–1.0)
Monocytes Relative: 8 %
Neutro Abs: 5.6 K/uL (ref 1.7–7.7)
Neutrophils Relative %: 75 %
Platelets: 404 K/uL — ABNORMAL HIGH (ref 150–400)
RBC: 4.41 MIL/uL (ref 3.87–5.11)
RDW: 14.6 % (ref 11.5–15.5)
WBC: 7.5 K/uL (ref 4.0–10.5)
nRBC: 0 % (ref 0.0–0.2)

## 2024-08-20 LAB — COMPREHENSIVE METABOLIC PANEL WITH GFR
ALT: 25 U/L (ref 0–44)
AST: 15 U/L (ref 15–41)
Albumin: 4.2 g/dL (ref 3.5–5.0)
Alkaline Phosphatase: 108 U/L (ref 38–126)
Anion gap: 18 — ABNORMAL HIGH (ref 5–15)
BUN: 7 mg/dL (ref 6–20)
CO2: 19 mmol/L — ABNORMAL LOW (ref 22–32)
Calcium: 9.9 mg/dL (ref 8.9–10.3)
Chloride: 101 mmol/L (ref 98–111)
Creatinine, Ser: 0.67 mg/dL (ref 0.44–1.00)
GFR, Estimated: >60 mL/min (ref >60)
Glucose, Bld: 101 mg/dL — ABNORMAL HIGH (ref 70–99)
Potassium: 3.6 mmol/L (ref 3.5–5.1)
Sodium: 137 mmol/L (ref 135–145)
Total Bilirubin: 0.4 mg/dL (ref 0.0–1.2)
Total Protein: 8.6 g/dL — ABNORMAL HIGH (ref 6.5–8.1)

## 2024-08-20 LAB — BASIC METABOLIC PANEL WITH GFR
Anion gap: 16 — ABNORMAL HIGH (ref 5–15)
BUN: <5 mg/dL — ABNORMAL LOW (ref 6–20)
CO2: 17 mmol/L — ABNORMAL LOW (ref 22–32)
Calcium: 9.2 mg/dL (ref 8.9–10.3)
Chloride: 105 mmol/L (ref 98–111)
Creatinine, Ser: 0.59 mg/dL (ref 0.44–1.00)
GFR, Estimated: >60 mL/min (ref >60)
Glucose, Bld: 81 mg/dL (ref 70–99)
Potassium: 4.5 mmol/L (ref 3.5–5.1)
Sodium: 138 mmol/L (ref 135–145)

## 2024-08-20 LAB — LIPASE, BLOOD: Lipase: 91 U/L — ABNORMAL HIGH (ref 11–51)

## 2024-08-20 LAB — HCG, SERUM, QUALITATIVE: Preg, Serum: NEGATIVE

## 2024-08-20 MED ORDER — SODIUM CHLORIDE 0.9 % IV BOLUS
1000.0000 mL | Freq: Once | INTRAVENOUS | Status: AC
  Administered 2024-08-20: 1000 mL via INTRAVENOUS

## 2024-08-20 MED ORDER — ONDANSETRON HCL 4 MG/2ML IJ SOLN
4.0000 mg | Freq: Once | INTRAMUSCULAR | Status: AC
  Administered 2024-08-20: 4 mg via INTRAVENOUS
  Filled 2024-08-20: qty 2

## 2024-08-20 MED ORDER — LACTATED RINGERS IV BOLUS
1000.0000 mL | Freq: Once | INTRAVENOUS | Status: AC
  Administered 2024-08-20: 1000 mL via INTRAVENOUS

## 2024-08-20 MED ORDER — MORPHINE SULFATE (PF) 4 MG/ML IV SOLN
4.0000 mg | Freq: Once | INTRAVENOUS | Status: AC
  Administered 2024-08-20: 4 mg via INTRAVENOUS
  Filled 2024-08-20: qty 1

## 2024-08-20 MED ORDER — THIAMINE HCL 100 MG/ML IJ SOLN
100.0000 mg | Freq: Once | INTRAMUSCULAR | Status: AC
  Administered 2024-08-20: 100 mg via INTRAVENOUS
  Filled 2024-08-20: qty 2

## 2024-08-20 MED ORDER — IOHEXOL 300 MG/ML  SOLN
100.0000 mL | Freq: Once | INTRAMUSCULAR | Status: AC | PRN
  Administered 2024-08-20: 100 mL via INTRAVENOUS

## 2024-08-20 MED ORDER — ONDANSETRON HCL 4 MG PO TABS: 4.0000 mg | ORAL_TABLET | Freq: Three times a day (TID) | ORAL | 0 refills | Status: AC | PRN

## 2024-08-20 MED ORDER — DICYCLOMINE HCL 20 MG PO TABS: 10.0000 mg | ORAL_TABLET | Freq: Two times a day (BID) | ORAL | 0 refills | Status: AC | PRN

## 2024-08-20 NOTE — Discharge Instructions (Addendum)
 Beauty ONEIDA Budge  Thank you for allowing us  to take care of you today.  You came to the Emergency Department today because you had nausea, vomiting, abdominal pain after recent surgery.  Here in the emergency department we gave you fluids and Zofran  and your vomiting stopped.  Your abdominal pain is improved but not totally gone.  We got a CT that does not show any major abnormalities.  We did speak with the general surgery team at [facility] who wanted you to have another liter of fluids as well as some vitamin B1 (thiamine) while you are here in the emergency department, however otherwise they did not have any other thoughts of other things that you needed from a surgical perspective.  We will discharge you with Bentyl  that you can use twice a day as needed for abdominal pain, as well as Zofran  that you can use every 8 hours as needed for nausea and vomiting.  We recommend following up closely with your PCP as well as your surgical team.  To-Do: 1. Please follow-up with your primary doctor within 1 - 2 weeks / as soon as possible.   Please return to the Emergency Department or call 911 if you experience have worsening of your symptoms, or do not get better, chest pain, shortness of breath, severe or significantly worsening pain, high fever, severe confusion, pass out or have any reason to think that you need emergency medical care.   We hope you feel better soon.   Mitzie Later, MD Department of Emergency Medicine [facility]

## 2024-08-20 NOTE — ED Provider Notes (Signed)
 [place] [facility] AT [facility] Provider Note   CSN: [id number] Arrival date & time: 08/20/24  9468     Patient presents with: Post-op Problem   Cathy Yang is a 38 y.o. female.   The history is provided by the patient.   She has history of hypertension, hyperlipidemia and is a 13 days status post duodenal switch surgery and comes in because of vomiting which started last night.  She has vomited at least 7 times, has been unable to hold anything down.  She is complaining of some abdominal pain which she relates both to her incisions healing and soreness from retching but she denies any other abdominal pain.  She denies fever or chills.  She called her surgeon who recommended that she come to the ED, get a CT scan, and also requested that a thiamine level be drawn.    Prior to Admission medications   Medication Sig Start Date End Date Taking? Authorizing Provider  acetaminophen  (TYLENOL ) 500 MG tablet Take 500 mg by mouth every 6 (six) hours as needed.    [provider]  benzonatate  (TESSALON ) 200 MG capsule Take 1 capsule (200 mg total) by mouth 3 (three) times daily as needed for cough. 11/13/22   LampteyAleene KIDD, MD  famotidine  (PEPCID ) 20 MG tablet Take 1 tablet (20 mg total) by mouth 2 (two) times daily as needed for heartburn or indigestion (abdominal pain). 02/27/22   Petrucelli, Samantha R, PA-C  gabapentin  (NEURONTIN ) 300 MG capsule Take 600-900 mg by mouth at bedtime.    [provider]  ondansetron  (ZOFRAN -ODT) 4 MG disintegrating tablet Take 1 tablet (4 mg total) by mouth every 8 (eight) hours as needed for nausea or vomiting. 11/13/22   Lamptey, Aleene KIDD, MD  tiZANidine (ZANAFLEX) 2 MG tablet Take 2 mg by mouth 3 (three) times daily. 04/30/22   [provider]    Allergies: Patient has no known allergies.    Review of Systems  All other systems reviewed and are negative.   Updated Vital Signs BP (!) 157/143 (BP  Location: Left Arm)   Pulse (!) 130   Temp 99.6 F (37.6 C) (Oral)   Resp 20   Ht 5' 5 (1.651 m)   Wt (!) 153.3 kg   SpO2 98%   BMI 56.25 kg/m   Physical Exam Vitals and nursing note reviewed.   38 year old female, resting comfortably and in no acute distress. Vital signs are significant for elevated blood pressure and elevated heart rate. Oxygen saturation is 98%, which is normal. Head is normocephalic and atraumatic. PERRLA, EOMI. Oropharynx is clear. Neck is nontender and supple without adenopathy. Lungs are clear without rales, wheezes, or rhonchi. Chest is nontender. Heart is tachycardic without murmur. Abdomen shows healing scars from recent laparoscopic surgery without signs of infection.  Abdomen has mild to moderate tenderness diffusely without rebound or guarding. Neurologic: Mental status is normal, cranial nerves are intact, moves all extremities equally.  (all labs ordered are listed, but only abnormal results are displayed) Labs Reviewed - No data to display  EKG: None  Radiology: No results found.   Procedures   Medications Ordered in the ED - No data to display  Clinical Course as of 08/20/24 0744  Wed Aug 20, 2024  9342 13d postop from duodenal switch, abdominal pain and vomiting, surgery done at [facility], fu labs and imaging  [LS]    Clinical Course User Index [LS] Rogelia [name], MD  Medical Decision Making Amount and/or Complexity of Data Reviewed Labs: ordered. Radiology: ordered.  Risk Prescription drug management.   Vomiting and abdominal pain in a patient postoperative duodenal switch surgery.  This is a presentation with wide range of treatment options and carries with a high risk of morbidity and complications.  Differential diagnosis includes, but is not limited to, small bowel obstruction, gastric outlet obstruction, internal hernia, viral gastritis.  I reviewed her past records confirming duodenal  switch surgery on 08/07/2024.  I have ordered IV fluids, morphine  for pain, ondansetron  for nausea and I have ordered screening labs and CT of abdomen and pelvis.  Case is signed out to Dr. Rogelia, oncoming physician.     Final diagnoses:  Nausea and vomiting, unspecified vomiting type  Generalized abdominal pain    ED Discharge Orders     None          Raford Lenis, MD 08/20/24 [phone]

## 2024-08-20 NOTE — ED Provider Notes (Signed)
 Patient stable after completing IV fluids and therapy.  Previous plan for discharge will stand.      Bari Roxie HERO, DO 08/20/24 1731

## 2024-08-20 NOTE — ED Provider Notes (Signed)
  [place] [facility] AT [facility] Provider Assume Care Note I assumed care of Cathy Yang on 08/20/2024 at 7 AM from Dr. Raford.   Briefly, Cathy Yang is a 38 y.o. female who: PMHx: Hypertension, chronic cholecystitis, hyperlipidemia, anemia, depression, malignant hyperthermia P/w abdominal pain and vomiting, currently 13 days postop from duodenal switch surgery performed at [facility] at the time of handoff: Follow-up labs and imaging   Please refer to the original provider's note for additional information regarding the care of Cathy Yang.  Reassessment: I personally reassessed the patient: Patient without any acute complaints or additional questions.  Vital Signs:  ED Triage Vitals  Encounter Vitals Group     BP 08/20/24 0539 (!) 157/143     Girls Systolic BP Percentile --      Girls Diastolic BP Percentile --      Boys Systolic BP Percentile --      Boys Diastolic BP Percentile --      Pulse Rate 08/20/24 0539 (!) 130     Resp 08/20/24 0539 20     Temp 08/20/24 0539 99.6 F (37.6 C)     Temp Source 08/20/24 0539 Oral     SpO2 08/20/24 0539 98 %     Weight 08/20/24 0540 (!) 338 lb (153.3 kg)     Height 08/20/24 0540 5' 5 (1.651 m)     Head Circumference --      Peak Flow --      Pain Score 08/20/24 0539 5     Pain Loc --      Pain Education --      Exclude from Growth Chart --      Hemodynamics:  The patient is hemodynamically stable. Mental Status:  The patient is alert  Additional MDM: Heart rate improved on reevaluation.  Patient has had resolution of emesis, reports that abdominal pain has improved.  Initially with mild anion gap elevation, likely from volume losses, improving on repeat labs from 18 ->16.  CT of the abdomen pelvis does not demonstrate acute intra-abdominal pathology.  I did consult Dr. Lucille at [facility] general surgery who recommended that patient receive a total of 2 L as well as IV thiamine while in the  ED, and otherwise is amenable to our plan for discharge with Bentyl  and Zofran  and follow-up with surgery.  This was communicated to patient.  At the time of signout, patient awaiting receipt of her LR bolus and thiamine injection, plan to discharge thereafter.  Disposition: DISCHARGE: I believe that the patient is safe for discharge home with outpatient follow-up. Patient was informed of all pertinent physical exam, laboratory, and imaging findings. Patient's suspected etiology of their symptom presentation was discussed with the patient and all questions were answered. We discussed following up with PCP, general surgery. I provided thorough ED return precautions. The patient feels safe and comfortable with this plan.   Cathy Yang Later, MD Emergency Medicine    Later Cathy RAMAN, MD 08/20/24 [phone]

## 2024-08-20 NOTE — ED Triage Notes (Signed)
 Pt had duodenal switch surgery 10/16 at [facility]. Pt began throwing up last night, has abdominal pain, and headache. Pt contacted surgeon this AM and surgeon stated for pt to get ct scan, fluids, and thiamine level.

## 2024-08-24 LAB — VITAMIN B1: Vitamin B1 (Thiamine): 107.7 nmol/L (ref 66.5–200.0)

## 2024-08-24 LAB — MISC LABCORP TEST (SEND OUT): Labcorp test code: 121186
